# Patient Record
Sex: Female | Born: 1950 | Race: Black or African American | Hispanic: No | Marital: Single | Smoking: Never smoker
Health system: Southern US, Community
[De-identification: ages and names within clinical notes are randomized; demographics above are authoritative.]

## PROBLEM LIST (undated history)

## (undated) DIAGNOSIS — G8929 Other chronic pain: Secondary | ICD-10-CM

## (undated) DIAGNOSIS — F1190 Opioid use, unspecified, uncomplicated: Secondary | ICD-10-CM

## (undated) DIAGNOSIS — I10 Essential (primary) hypertension: Secondary | ICD-10-CM

## (undated) DIAGNOSIS — E7800 Pure hypercholesterolemia, unspecified: Secondary | ICD-10-CM

## (undated) DIAGNOSIS — F32A Depression, unspecified: Secondary | ICD-10-CM

## (undated) DIAGNOSIS — K219 Gastro-esophageal reflux disease without esophagitis: Secondary | ICD-10-CM

## (undated) DIAGNOSIS — I729 Aneurysm of unspecified site: Secondary | ICD-10-CM

## (undated) DIAGNOSIS — K449 Diaphragmatic hernia without obstruction or gangrene: Secondary | ICD-10-CM

## (undated) DIAGNOSIS — F329 Major depressive disorder, single episode, unspecified: Secondary | ICD-10-CM

## (undated) DIAGNOSIS — M159 Polyosteoarthritis, unspecified: Secondary | ICD-10-CM

## (undated) HISTORY — DX: Depression, unspecified: F32.A

## (undated) HISTORY — PX: BLADDER SURGERY: SHX569

## (undated) HISTORY — PX: CHOLECYSTECTOMY: SHX55

## (undated) HISTORY — PX: COLONOSCOPY: SHX174

## (undated) HISTORY — DX: Polyosteoarthritis, unspecified: M15.9

## (undated) HISTORY — DX: Major depressive disorder, single episode, unspecified: F32.9

## (undated) HISTORY — PX: UTERINE FIBROID SURGERY: SHX826

---

## 1998-05-27 HISTORY — PX: BRAIN SURGERY: SHX531

## 2015-05-19 ENCOUNTER — Emergency Department (HOSPITAL_COMMUNITY): Payer: Commercial Managed Care - HMO

## 2015-05-19 ENCOUNTER — Observation Stay (HOSPITAL_COMMUNITY)
Admission: EM | Admit: 2015-05-19 | Discharge: 2015-05-20 | Disposition: A | Discharge status: Home / Self Care | Payer: Commercial Managed Care - HMO | Attending: Internal Medicine | Admitting: Internal Medicine

## 2015-05-19 ENCOUNTER — Encounter: Payer: Self-pay | Admitting: Orthopedic Surgery

## 2015-05-19 ENCOUNTER — Encounter (HOSPITAL_COMMUNITY): Payer: Self-pay | Admitting: Emergency Medicine

## 2015-05-19 DIAGNOSIS — K529 Noninfective gastroenteritis and colitis, unspecified: Principal | ICD-10-CM | POA: Diagnosis present

## 2015-05-19 DIAGNOSIS — I10 Essential (primary) hypertension: Secondary | ICD-10-CM | POA: Insufficient documentation

## 2015-05-19 DIAGNOSIS — K449 Diaphragmatic hernia without obstruction or gangrene: Secondary | ICD-10-CM | POA: Insufficient documentation

## 2015-05-19 DIAGNOSIS — R109 Unspecified abdominal pain: Secondary | ICD-10-CM | POA: Diagnosis present

## 2015-05-19 DIAGNOSIS — Z79899 Other long term (current) drug therapy: Secondary | ICD-10-CM | POA: Diagnosis not present

## 2015-05-19 DIAGNOSIS — E7800 Pure hypercholesterolemia, unspecified: Secondary | ICD-10-CM | POA: Insufficient documentation

## 2015-05-19 DIAGNOSIS — R509 Fever, unspecified: Secondary | ICD-10-CM | POA: Diagnosis not present

## 2015-05-19 HISTORY — DX: Pure hypercholesterolemia, unspecified: E78.00

## 2015-05-19 HISTORY — DX: Essential (primary) hypertension: I10

## 2015-05-19 HISTORY — DX: Diaphragmatic hernia without obstruction or gangrene: K44.9

## 2015-05-19 LAB — COMPREHENSIVE METABOLIC PANEL
ALBUMIN: 3.6 g/dL (ref 3.5–5.0)
ALT: 21 U/L (ref 14–54)
ANION GAP: 8 (ref 5–15)
AST: 21 U/L (ref 15–41)
Alkaline Phosphatase: 86 U/L (ref 38–126)
BILIRUBIN TOTAL: 1.2 mg/dL (ref 0.3–1.2)
BUN: 15 mg/dL (ref 6–20)
CHLORIDE: 103 mmol/L (ref 101–111)
CO2: 27 mmol/L (ref 22–32)
Calcium: 8.9 mg/dL (ref 8.9–10.3)
Creatinine, Ser: 1.12 mg/dL — ABNORMAL HIGH (ref 0.44–1.00)
GFR calc Af Amer: 59 mL/min — ABNORMAL LOW (ref >60)
GFR calc non Af Amer: 51 mL/min — ABNORMAL LOW (ref >60)
GLUCOSE: 109 mg/dL — AB (ref 65–99)
POTASSIUM: 3.6 mmol/L (ref 3.5–5.1)
SODIUM: 138 mmol/L (ref 135–145)
TOTAL PROTEIN: 7.2 g/dL (ref 6.5–8.1)

## 2015-05-19 LAB — URINALYSIS, ROUTINE W REFLEX MICROSCOPIC
BILIRUBIN URINE: NEGATIVE
Glucose, UA: NEGATIVE mg/dL
Hgb urine dipstick: NEGATIVE
KETONES UR: NEGATIVE mg/dL
Leukocytes, UA: NEGATIVE
NITRITE: NEGATIVE
PROTEIN: NEGATIVE mg/dL
Specific Gravity, Urine: 1.015 (ref 1.005–1.030)
pH: 8.5 — ABNORMAL HIGH (ref 5.0–8.0)

## 2015-05-19 LAB — CBC WITH DIFFERENTIAL/PLATELET
BASOS PCT: 0 %
Basophils Absolute: 0 10*3/uL (ref 0.0–0.1)
Eosinophils Absolute: 0 10*3/uL (ref 0.0–0.7)
Eosinophils Relative: 0 %
HEMATOCRIT: 40.2 % (ref 36.0–46.0)
Hemoglobin: 13.4 g/dL (ref 12.0–15.0)
Lymphocytes Relative: 16 %
Lymphs Abs: 1.1 10*3/uL (ref 0.7–4.0)
MCH: 29.6 pg (ref 26.0–34.0)
MCHC: 33.3 g/dL (ref 30.0–36.0)
MCV: 88.7 fL (ref 78.0–100.0)
MONO ABS: 0.5 10*3/uL (ref 0.1–1.0)
Monocytes Relative: 7 %
NEUTROS ABS: 5.6 10*3/uL (ref 1.7–7.7)
Neutrophils Relative %: 77 %
PLATELETS: 173 10*3/uL (ref 150–400)
RBC: 4.53 MIL/uL (ref 3.87–5.11)
RDW: 13 % (ref 11.5–15.5)
WBC: 7.2 10*3/uL (ref 4.0–10.5)

## 2015-05-19 LAB — LACTIC ACID, PLASMA
LACTIC ACID, VENOUS: 1.2 mmol/L (ref 0.5–2.0)
LACTIC ACID, VENOUS: 0.8 mmol/L (ref 0.5–2.0)

## 2015-05-19 LAB — LIPASE, BLOOD: Lipase: 18 U/L (ref 11–51)

## 2015-05-19 LAB — TROPONIN I: Troponin I: <0.03 ng/mL (ref <0.031)

## 2015-05-19 MED ORDER — DICYCLOMINE HCL 10 MG/ML IM SOLN
20.0000 mg | Freq: Once | INTRAMUSCULAR | Status: AC
  Administered 2015-05-19: 20 mg via INTRAMUSCULAR
  Filled 2015-05-19: qty 2

## 2015-05-19 MED ORDER — HYDROCHLOROTHIAZIDE 25 MG PO TABS
25.0000 mg | ORAL_TABLET | Freq: Every day | ORAL | Status: DC
  Administered 2015-05-20: 25 mg via ORAL
  Filled 2015-05-19: qty 1

## 2015-05-19 MED ORDER — SODIUM CHLORIDE 0.9 % IV SOLN
INTRAVENOUS | Status: DC
  Administered 2015-05-19 – 2015-05-20 (×3): 100 mL/h via INTRAVENOUS

## 2015-05-19 MED ORDER — HEPARIN SODIUM (PORCINE) 5000 UNIT/ML IJ SOLN
5000.0000 [IU] | Freq: Three times a day (TID) | INTRAMUSCULAR | Status: DC
  Administered 2015-05-19 – 2015-05-20 (×2): 5000 [IU] via SUBCUTANEOUS
  Filled 2015-05-19 (×2): qty 1

## 2015-05-19 MED ORDER — ONDANSETRON HCL 4 MG/2ML IJ SOLN: 4.0000 mg | Freq: Four times a day (QID) | INTRAMUSCULAR | Status: DC | PRN

## 2015-05-19 MED ORDER — POTASSIUM CHLORIDE CRYS ER 10 MEQ PO TBCR
10.0000 meq | EXTENDED_RELEASE_TABLET | Freq: Two times a day (BID) | ORAL | Status: DC
  Administered 2015-05-19 – 2015-05-20 (×2): 10 meq via ORAL
  Filled 2015-05-19 (×2): qty 1

## 2015-05-19 MED ORDER — IOHEXOL 300 MG/ML  SOLN
100.0000 mL | Freq: Once | INTRAMUSCULAR | Status: AC | PRN
  Administered 2015-05-19: 100 mL via INTRAVENOUS

## 2015-05-19 MED ORDER — ACETAMINOPHEN 325 MG PO TABS
650.0000 mg | ORAL_TABLET | Freq: Four times a day (QID) | ORAL | Status: DC | PRN
Start: 2015-05-19 — End: 2015-05-20

## 2015-05-19 MED ORDER — METRONIDAZOLE IN NACL 5-0.79 MG/ML-% IV SOLN: 500.0000 mg | Freq: Three times a day (TID) | INTRAVENOUS | Status: DC

## 2015-05-19 MED ORDER — IOHEXOL 300 MG/ML  SOLN
50.0000 mL | Freq: Once | INTRAMUSCULAR | Status: AC | PRN
  Administered 2015-05-19: 50 mL via ORAL

## 2015-05-19 MED ORDER — HYDROCODONE-ACETAMINOPHEN 10-325 MG PO TABS
1.0000 | ORAL_TABLET | Freq: Four times a day (QID) | ORAL | Status: DC | PRN
  Administered 2015-05-19: 1 via ORAL
  Filled 2015-05-19: qty 1

## 2015-05-19 MED ORDER — ONDANSETRON HCL 4 MG/2ML IJ SOLN
4.0000 mg | INTRAMUSCULAR | Status: DC | PRN
  Filled 2015-05-19: qty 2

## 2015-05-19 MED ORDER — PROMETHAZINE HCL 25 MG/ML IJ SOLN: 12.5000 mg | Freq: Four times a day (QID) | INTRAMUSCULAR | Status: DC | PRN

## 2015-05-19 MED ORDER — FAMOTIDINE IN NACL 20-0.9 MG/50ML-% IV SOLN
20.0000 mg | Freq: Once | INTRAVENOUS | Status: AC
  Administered 2015-05-19: 20 mg via INTRAVENOUS
  Filled 2015-05-19: qty 50

## 2015-05-19 MED ORDER — CIPROFLOXACIN IN D5W 400 MG/200ML IV SOLN
400.0000 mg | Freq: Two times a day (BID) | INTRAVENOUS | Status: DC
  Administered 2015-05-19 – 2015-05-20 (×2): 400 mg via INTRAVENOUS
  Filled 2015-05-19 (×2): qty 200

## 2015-05-19 MED ORDER — ACETAMINOPHEN 500 MG PO TABS
1000.0000 mg | ORAL_TABLET | Freq: Once | ORAL | Status: AC
  Administered 2015-05-19: 1000 mg via ORAL
  Filled 2015-05-19: qty 2

## 2015-05-19 MED ORDER — SIMVASTATIN 20 MG PO TABS: 40.0000 mg | ORAL_TABLET | Freq: Every day | ORAL | Status: DC

## 2015-05-19 MED ORDER — METRONIDAZOLE IN NACL 5-0.79 MG/ML-% IV SOLN
500.0000 mg | Freq: Once | INTRAVENOUS | Status: AC
  Administered 2015-05-19: 500 mg via INTRAVENOUS
  Filled 2015-05-19: qty 100

## 2015-05-19 MED ORDER — FAMOTIDINE 20 MG PO TABS: 40.0000 mg | ORAL_TABLET | Freq: Every day | ORAL | Status: DC

## 2015-05-19 MED ORDER — LOSARTAN POTASSIUM 50 MG PO TABS
100.0000 mg | ORAL_TABLET | Freq: Every day | ORAL | Status: DC
  Administered 2015-05-20: 100 mg via ORAL
  Filled 2015-05-19: qty 2

## 2015-05-19 MED ORDER — LOSARTAN POTASSIUM-HCTZ 100-25 MG PO TABS: 1.0000 | ORAL_TABLET | Freq: Every day | ORAL | Status: DC

## 2015-05-19 MED ORDER — FAMOTIDINE 20 MG PO TABS
40.0000 mg | ORAL_TABLET | Freq: Every day | ORAL | Status: DC
  Administered 2015-05-20: 40 mg via ORAL
  Filled 2015-05-19: qty 2

## 2015-05-19 MED ORDER — METRONIDAZOLE IN NACL 5-0.79 MG/ML-% IV SOLN
500.0000 mg | Freq: Three times a day (TID) | INTRAVENOUS | Status: DC
  Administered 2015-05-20: 500 mg via INTRAVENOUS
  Filled 2015-05-19: qty 100

## 2015-05-19 MED ORDER — MELOXICAM 7.5 MG PO TABS
15.0000 mg | ORAL_TABLET | Freq: Every day | ORAL | Status: DC | PRN
  Filled 2015-05-19: qty 1

## 2015-05-19 NOTE — ED Provider Notes (Signed)
CSN: [id number]     Arrival date & time 05/19/15  1615 History   First MD Initiated Contact with Patient 05/19/15 1622     Chief Complaint  Patient presents with  . Abdominal Pain  . Nausea     HPI  Pt was seen at 1625.  Per pt, c/o gradual onset and persistence of constant generalized abd "pain" for the past 2 to 3 days.  Has been associated with nausea and urinary frequency.  Describes the abd pain as "cramping."  Denies vomiting/diarrhea, no fevers, no back pain, no rash, no CP/SOB, no black or blood in stools, no dysuria.    Past Medical History  Diagnosis Date  . Hiatal hernia   . Hypertension   . High cholesterol    Past Surgical History  Procedure Laterality Date  . Brain surgery      Social History  Substance Use Topics  . Smoking status: Never Smoker   . Smokeless tobacco: None  . Alcohol Use: No    Review of Systems ROS: Statement: All systems negative except as marked or noted in the HPI; Constitutional: Negative for fever and chills. ; ; Eyes: Negative for eye pain, redness and discharge. ; ; ENMT: Negative for ear pain, hoarseness, nasal congestion, sinus pressure and sore throat. ; ; Cardiovascular: Negative for chest pain, palpitations, diaphoresis, dyspnea and peripheral edema. ; ; Respiratory: Negative for cough, wheezing and stridor. ; ; Gastrointestinal: +nausea, abd pain. Negative for vomiting, diarrhea, blood in stool, hematemesis, jaundice and rectal bleeding. . ; ; Genitourinary: +urinary frequency. Negative for dysuria, flank pain and hematuria. ; ; Musculoskeletal: Negative for back pain and neck pain. Negative for swelling and trauma.; ; Skin: Negative for pruritus, rash, abrasions, blisters, bruising and skin lesion.; ; Neuro: Negative for headache, lightheadedness and neck stiffness. Negative for weakness, altered level of consciousness , altered mental status, extremity weakness, paresthesias, involuntary movement, seizure and syncope.        Allergies  Review of patient's allergies indicates no known allergies.  Home Medications   Prior to Admission medications   Medication Sig Start Date End Date Taking? Authorizing Provider  ARIPiprazole (ABILIFY) 5 MG tablet Take 5 mg by mouth daily. 05/12/15   Historical Provider, MD  HYDROcodone-acetaminophen (NORCO) 10-325 MG tablet Take 1 tablet by mouth every 6 (six) hours as needed. 05/08/15   Historical Provider, MD  losartan-hydrochlorothiazide (HYZAAR) 100-25 MG tablet Take 1 tablet by mouth daily. 05/12/15   Historical Provider, MD  meloxicam (MOBIC) 15 MG tablet Take 15 mg by mouth daily. 05/12/15   Historical Provider, MD  potassium chloride (K-DUR,KLOR-CON) 10 MEQ tablet Take 10 mEq by mouth 2 (two) times daily. 05/12/15   Historical Provider, MD  ranitidine (ZANTAC) 300 MG tablet Take 300 mg by mouth daily. 05/12/15   Historical Provider, MD  simvastatin (ZOCOR) 40 MG tablet Take 40 mg by mouth daily. 05/12/15   Historical Provider, MD   BP 117/70 mmHg  Pulse 117  Temp(Src) 101.7 F (38.7 C) (Oral)  Resp 17  Ht 5\' 2"  (1.575 m)  Wt 215 lb (97.523 kg)  BMI 39.31 kg/m2  SpO2 96%   Patient Vitals for the past 24 hrs:  BP Temp Temp src Pulse Resp SpO2 Height Weight  05/19/15 2135 120/69 mmHg 98 F (36.7 C) Oral 100 (!) 22 100 % 5\' 2"  (1.575 m) 210 lb 15.7 oz (95.7 kg)  05/19/15 1930 100/61 mmHg - - - 24 - - -  05/19/15 1901 - 102.9  F (39.4 C) Oral - - - - -  05/19/15 1830 117/70 mmHg - - 117 17 96 % - -  05/19/15 1800 137/68 mmHg - - - (!) 34 - - -  05/19/15 1735 - 101.7 F (38.7 C) Oral - - - - -  05/19/15 1700 141/92 mmHg - - - 16 - - -    Physical Exam  1630: Physical examination:  Nursing notes reviewed; Vital signs and O2 SAT reviewed; +febrile.;; Constitutional: Well developed, Well nourished, In no acute distress; Head:  Normocephalic, atraumatic; Eyes: EOMI, PERRL, No scleral icterus; ENMT: Mouth and pharynx normal, Mucous membranes dry; Neck:  Supple, Full range of motion, No lymphadenopathy; Cardiovascular: Tachycardic rate and rhythm, No gallop; Respiratory: Breath sounds clear & equal bilaterally, No wheezes.  Speaking full sentences with ease, Normal respiratory effort/excursion; Chest: Nontender, Movement normal; Abdomen: Soft, +mild diffuse tenderness to palp. No rebound or guarding. Nondistended, Normal bowel sounds; Genitourinary: No CVA tenderness; Extremities: Pulses normal, No tenderness, No edema, No calf edema or asymmetry.; Neuro: AA&Ox3, Major CN grossly intact.  Speech clear. No gross focal motor or sensory deficits in extremities.; Skin: Color normal, Warm, Dry.   ED Course  Procedures (including critical care time) Labs Review   Imaging Review  I have personally reviewed and evaluated these images and lab results as part of my medical decision-making.   EKG Interpretation   Date/Time:  Friday May 19 2015 16:36:22 EST Ventricular Rate:  110 PR Interval:  132 QRS Duration: 92 QT Interval:  318 QTC Calculation: 430 R Axis:   50 Text Interpretation:  Sinus tachycardia Abnormal R-wave progression, early  transition Borderline repolarization abnormality No old tracing to compare  Confirmed by [facility]  MD, Nunzio Cory [phone]) on 05/19/2015 4:59:04 PM      MDM  MDM Reviewed: nursing note and vitals Interpretation: labs, CT scan, ECG and x-ray     Results for orders placed or performed during the hospital encounter of 05/19/15  Urinalysis, Routine w reflex microscopic  Result Value Ref Range   Color, Urine YELLOW YELLOW   APPearance CLEAR CLEAR   Specific Gravity, Urine 1.015 1.005 - 1.030   pH 8.5 (H) 5.0 - 8.0   Glucose, UA NEGATIVE NEGATIVE mg/dL   Hgb urine dipstick NEGATIVE NEGATIVE   Bilirubin Urine NEGATIVE NEGATIVE   Ketones, ur NEGATIVE NEGATIVE mg/dL   Protein, ur NEGATIVE NEGATIVE mg/dL   Nitrite NEGATIVE NEGATIVE   Leukocytes, UA NEGATIVE NEGATIVE  Comprehensive metabolic panel   Result Value Ref Range   Sodium 138 135 - 145 mmol/L   Potassium 3.6 3.5 - 5.1 mmol/L   Chloride 103 101 - 111 mmol/L   CO2 27 22 - 32 mmol/L   Glucose, Bld 109 (H) 65 - 99 mg/dL   BUN 15 6 - 20 mg/dL   Creatinine, Ser 1.12 (H) 0.44 - 1.00 mg/dL   Calcium 8.9 8.9 - 10.3 mg/dL   Total Protein 7.2 6.5 - 8.1 g/dL   Albumin 3.6 3.5 - 5.0 g/dL   AST 21 15 - 41 U/L   ALT 21 14 - 54 U/L   Alkaline Phosphatase 86 38 - 126 U/L   Total Bilirubin 1.2 0.3 - 1.2 mg/dL   GFR calc non Af Amer 51 (L) >60 mL/min   GFR calc Af Amer 59 (L) >60 mL/min   Anion gap 8 5 - 15  Lipase, blood  Result Value Ref Range   Lipase 18 11 - 51 U/L  Troponin I  Result Value Ref Range   Troponin I <0.03 <0.031 ng/mL  Lactic acid, plasma  Result Value Ref Range   Lactic Acid, Venous 1.2 0.5 - 2.0 mmol/L  CBC with Differential  Result Value Ref Range   WBC 7.2 4.0 - 10.5 K/uL   RBC 4.53 3.87 - 5.11 MIL/uL   Hemoglobin 13.4 12.0 - 15.0 g/dL   HCT 40.2 36.0 - 46.0 %   MCV 88.7 78.0 - 100.0 fL   MCH 29.6 26.0 - 34.0 pg   MCHC 33.3 30.0 - 36.0 g/dL   RDW 13.0 11.5 - 15.5 %   Platelets 173 150 - 400 K/uL   Neutrophils Relative % 77 %   Neutro Abs 5.6 1.7 - 7.7 K/uL   Lymphocytes Relative 16 %   Lymphs Abs 1.1 0.7 - 4.0 K/uL   Monocytes Relative 7 %   Monocytes Absolute 0.5 0.1 - 1.0 K/uL   Eosinophils Relative 0 %   Eosinophils Absolute 0.0 0.0 - 0.7 K/uL   Basophils Relative 0 %   Basophils Absolute 0.0 0.0 - 0.1 K/uL   Dg Chest 2 View 05/19/2015  CLINICAL DATA:  64 year old female with fever nausea and lower abdominal pain x3 days. EXAM: CHEST  2 VIEW COMPARISON:  None. FINDINGS: Two views of the chest do not demonstrate any focal consolidation. There is no pleural effusion or pneumothorax. Mild cardiomegaly. The osseous structures are grossly unremarkable. IMPRESSION: No active cardiopulmonary disease. Electronically Signed   By: Anner Crete M.D.   On: 05/19/2015 18:27   Ct Abdomen Pelvis W  Contrast 05/19/2015  CLINICAL DATA:  Patient c/o low abd pain and urinary frequency x 3 days/ also c/o nausea; hx HTN, hiatal hernia EXAM: CT ABDOMEN AND PELVIS WITH CONTRAST TECHNIQUE: Multidetector CT imaging of the abdomen and pelvis was performed using the standard protocol following bolus administration of intravenous contrast. CONTRAST:  5mL OMNIPAQUE IOHEXOL 300 MG/ML SOLN, 110mL OMNIPAQUE IOHEXOL 300 MG/ML SOLN COMPARISON:  None. FINDINGS: Visualized lung bases clear. Surgical clips in the gallbladder fossa. Unremarkable liver, spleen, adrenal glands, kidneys, pancreas, portal vein, and abdominal aorta. Small hiatal hernia. Stomach, small bowel, and colon are nondilated. Normal appendix. Mild wall thickening in the cecum with mild adjacent inflammatory/ edematous changes. Subcentimeter right mesenteric lymph nodes. Scattered transverse, descending, and sigmoid diverticula without significant adjacent inflammatory/ edematous change. Prominent uterus, with Complex 3.6 cm masslike process centrally in the endometrial cavity. Adnexal regions unremarkable. Urinary bladder incompletely distended. No ascites.  No free air.  No adenopathy. Spondylitic changes in the visualized lower thoracic spine. Bilateral sacroiliitis. IMPRESSION: 1. Mild wall thickening in the proximal ascending colon with some mild adjacent inflammatory/edematous changes suggesting colitis. 2. Masslike process in the endometrial cavity, may represent degenerating submucosal fibroid versus endometrial neoplasm. Recommend gynecologic follow-up. 3. Transverse, descending, and sigmoid diverticulosis. Electronically Signed   By: Lucrezia Europe M.D.   On: 05/19/2015 18:25    1920:  Pt's significant other at bedside demanding pt receive "dilaudid" because "she'll die without it." Pt herself denies she is in pain. Pt has received tylenol for her fever. Significant other states pt "doesn't need tylenol" because "it will shut down your kidneys."  Attempted to explain pharmacology of APAP, but pt's significant other kept interrupting me and talking over me when I attempted to speak, telling me that I "was wrong" and "don't know what you're talking about like most people."  Dx and testing d/w pt and family.  Questions answered.  Verb understanding, agreeable to  admit. T/C to [facility]. Alcario Drought, case discussed, including:  HPI, pertinent PM/SHx, VS/PE, dx testing, ED course and treatment:  Agreeable to admit, requests to write temporary orders, obtain medical bed to team [facility].   Francine Graven, DO 05/20/15 1640

## 2015-05-19 NOTE — ED Notes (Signed)
Pt resting comfortably at this time.  Significant other at bedside states that she "needs Dilaudid".  Pt is not requesting pain meds at this time.  Sig. Other became very argumentative that pt would die without pain medication.  Pt denies severe pain at this time and states that she is "uncomfortable".  Advised visitor that narcotic pain medication is not warranted for being uncomfortable and that I would make the doctor aware.  Pt is silent at this time and let visitor rant.  Visitor states that if she continues to not receive pain medications he may just take her to [facility].

## 2015-05-19 NOTE — ED Notes (Signed)
Pt states that she has been having lower abdominal pain with urinary frequency for about 3 days with nausea.

## 2015-05-19 NOTE — H&P (Addendum)
[facility] History and Physical  Tangerine Looby [id number] DOB: 1950/11/12 DOA: 05/19/2015  Referring physician: EDP PCP: No primary care provider on file.   Chief Complaint: Abd pain   HPI: Sandra Gross is a 64 y.o. female who presents to the ED with c/o abdominal pain.  Pain is generalized throughout abdomen, constant since onset 2-3 days ago.  Associated with nausea.  Pain is "cramping".  No vomiting or diarrhea.  Denies subjective fevers (objectively Tm is 102.9 in ED).  Review of Systems: Systems reviewed.  As above, otherwise negative  Past Medical History  Diagnosis Date  . Hiatal hernia   . Hypertension   . High cholesterol    Past Surgical History  Procedure Laterality Date  . Brain surgery     Social History:  reports that she has never smoked. She does not have any smokeless tobacco history on file. She reports that she does not drink alcohol or use illicit drugs.  No Known Allergies  History reviewed. No pertinent family history.   Prior to Admission medications   Medication Sig Start Date End Date Taking? Authorizing Provider  HYDROcodone-acetaminophen (NORCO) 10-325 MG tablet Take 1 tablet by mouth every 6 (six) hours as needed for moderate pain or severe pain.  05/08/15  Yes Historical Provider, MD  losartan-hydrochlorothiazide (HYZAAR) 100-25 MG tablet Take 1 tablet by mouth daily. 05/12/15  Yes Historical Provider, MD  meloxicam (MOBIC) 15 MG tablet Take 15 mg by mouth daily as needed for pain.  05/12/15  Yes Historical Provider, MD  potassium chloride (K-DUR,KLOR-CON) 10 MEQ tablet Take 10 mEq by mouth 2 (two) times daily. 05/12/15  Yes Historical Provider, MD  ranitidine (ZANTAC) 300 MG tablet Take 300 mg by mouth daily. 05/12/15  Yes Historical Provider, MD  simvastatin (ZOCOR) 40 MG tablet Take 40 mg by mouth daily. 05/12/15  Yes Historical Provider, MD   Physical Exam: Filed Vitals:   05/19/15 1901 05/19/15 1930  BP:  100/61  Pulse:     Temp: 102.9 F (39.4 C)   Resp:  24    BP 100/61 mmHg  Pulse 117  Temp(Src) 102.9 F (39.4 C) (Oral)  Resp 24  Ht 5\' 2"  (1.575 m)  Wt 97.523 kg (215 lb)  BMI 39.31 kg/m2  SpO2 96%  General Appearance:    Alert, oriented, no distress, appears stated age  Head:    Normocephalic, atraumatic  Eyes:    PERRL, EOMI, sclera non-icteric        Nose:   Nares without drainage or epistaxis. Mucosa, turbinates normal  Throat:   Moist mucous membranes. Oropharynx without erythema or exudate.  Neck:   Supple. No carotid bruits.  No thyromegaly.  No lymphadenopathy.   Back:     No CVA tenderness, no spinal tenderness  Lungs:     Clear to auscultation bilaterally, without wheezes, rhonchi or rales  Chest wall:    No tenderness to palpitation  Heart:    Regular rate and rhythm without murmurs, gallops, rubs  Abdomen:     Soft, non-tender, nondistended, normal bowel sounds, no organomegaly  Genitalia:    deferred  Rectal:    deferred  Extremities:   No clubbing, cyanosis or edema.  Pulses:   2+ and symmetric all extremities  Skin:   Skin color, texture, turgor normal, no rashes or lesions  Lymph nodes:   Cervical, supraclavicular, and axillary nodes normal  Neurologic:   CNII-XII intact. Normal strength, sensation and reflexes      throughout  Labs on Admission:  Basic Metabolic Panel:  Recent Labs Lab 05/19/15 1704  NA 138  K 3.6  CL 103  CO2 27  GLUCOSE 109*  BUN 15  CREATININE 1.12*  CALCIUM 8.9   Liver Function Tests:  Recent Labs Lab 05/19/15 1704  AST 21  ALT 21  ALKPHOS 86  BILITOT 1.2  PROT 7.2  ALBUMIN 3.6    Recent Labs Lab 05/19/15 1704  LIPASE 18   No results for input(s): AMMONIA in the last 168 hours. CBC:  Recent Labs Lab 05/19/15 1704  WBC 7.2  NEUTROABS 5.6  HGB 13.4  HCT 40.2  MCV 88.7  PLT 173   Cardiac Enzymes:  Recent Labs Lab 05/19/15 1704  TROPONINI <0.03    BNP (last 3 results) No results for input(s): PROBNP in  the last 8760 hours. CBG: No results for input(s): GLUCAP in the last 168 hours.  Radiological Exams on Admission: Dg Chest 2 View  05/19/2015  CLINICAL DATA:  64 year old female with fever nausea and lower abdominal pain x3 days. EXAM: CHEST  2 VIEW COMPARISON:  None. FINDINGS: Two views of the chest do not demonstrate any focal consolidation. There is no pleural effusion or pneumothorax. Mild cardiomegaly. The osseous structures are grossly unremarkable. IMPRESSION: No active cardiopulmonary disease. Electronically Signed   By: Anner Crete M.D.   On: 05/19/2015 18:27   Ct Abdomen Pelvis W Contrast  05/19/2015  CLINICAL DATA:  Patient c/o low abd pain and urinary frequency x 3 days/ also c/o nausea; hx HTN, hiatal hernia EXAM: CT ABDOMEN AND PELVIS WITH CONTRAST TECHNIQUE: Multidetector CT imaging of the abdomen and pelvis was performed using the standard protocol following bolus administration of intravenous contrast. CONTRAST:  20mL OMNIPAQUE IOHEXOL 300 MG/ML SOLN, 136mL OMNIPAQUE IOHEXOL 300 MG/ML SOLN COMPARISON:  None. FINDINGS: Visualized lung bases clear. Surgical clips in the gallbladder fossa. Unremarkable liver, spleen, adrenal glands, kidneys, pancreas, portal vein, and abdominal aorta. Small hiatal hernia. Stomach, small bowel, and colon are nondilated. Normal appendix. Mild wall thickening in the cecum with mild adjacent inflammatory/ edematous changes. Subcentimeter right mesenteric lymph nodes. Scattered transverse, descending, and sigmoid diverticula without significant adjacent inflammatory/ edematous change. Prominent uterus, with Complex 3.6 cm masslike process centrally in the endometrial cavity. Adnexal regions unremarkable. Urinary bladder incompletely distended. No ascites.  No free air.  No adenopathy. Spondylitic changes in the visualized lower thoracic spine. Bilateral sacroiliitis. IMPRESSION: 1. Mild wall thickening in the proximal ascending colon with some mild adjacent  inflammatory/edematous changes suggesting colitis. 2. Masslike process in the endometrial cavity, may represent degenerating submucosal fibroid versus endometrial neoplasm. Recommend gynecologic follow-up. 3. Transverse, descending, and sigmoid diverticulosis. Electronically Signed   By: Lucrezia Europe M.D.   On: 05/19/2015 18:25    EKG: Independently reviewed.  Assessment/Plan Principal Problem:   Colitis Active Problems:   HTN (hypertension)   1. Colitis - 1. Empiric cipro/flagyl 2. IVF 3. Nausea control 4. Tylenol PRN fever 2. HTN - continue home meds    Code Status: Full  Family Communication: Husband at bedside Disposition Plan: Admit to obs   Time spent: 50 min  Marisa Hufstetler M. [facility] Pager [phone]  If 7AM-7PM, please contact the day team taking care of the patient [email or web address] Password [facility] 05/19/2015, 7:58 PM

## 2015-05-19 NOTE — Progress Notes (Signed)
ANTIBIOTIC CONSULT NOTE - INITIAL  Pharmacy Consult for Cipro Indication: intra abdominal infection  No Known Allergies  Patient Measurements: Height: 5\' 2"  (157.5 cm) Weight: 215 lb (97.523 kg) IBW/kg (Calculated) : 50.1 Adjusted Body Weight:   Vital Signs: Temp: 101.7 F (38.7 C) (12/23 1735) Temp Source: Oral (12/23 1735) BP: 117/70 mmHg (12/23 1830) Pulse Rate: 117 (12/23 1830) Intake/Output from previous day:   Intake/Output from this shift:    Labs:  Recent Labs  05/19/15 1704  WBC 7.2  HGB 13.4  PLT 173  CREATININE 1.12*   Estimated Creatinine Clearance: 55.4 mL/min (by C-G formula based on Cr of 1.12). No results for input(s): VANCOTROUGH, VANCOPEAK, VANCORANDOM, GENTTROUGH, GENTPEAK, GENTRANDOM, TOBRATROUGH, TOBRAPEAK, TOBRARND, AMIKACINPEAK, AMIKACINTROU, AMIKACIN in the last 72 hours.   Microbiology: No results found for this or any previous visit (from the past 720 hour(s)).  Medical History: Past Medical History  Diagnosis Date  . Hiatal hernia   . Hypertension   . High cholesterol     Medications:   (Not in a hospital admission) Assessment: 64 yo female ED patient, lower abdominal pain for 3 days with nausea. Cipro per pharmacy consult  Goal of Therapy:  Eradicate infection  Plan:  Cipro 400 mg IV every 12 hours Monitor renal function Labs per protocol F/U oral therapy when appropriate  Abner Greenspan, Advit Trethewey Bennett 05/19/2015,6:55 PM

## 2015-05-20 DIAGNOSIS — K529 Noninfective gastroenteritis and colitis, unspecified: Secondary | ICD-10-CM

## 2015-05-20 DIAGNOSIS — I10 Essential (primary) hypertension: Secondary | ICD-10-CM | POA: Diagnosis not present

## 2015-05-20 DIAGNOSIS — E876 Hypokalemia: Secondary | ICD-10-CM

## 2015-05-20 LAB — BASIC METABOLIC PANEL
ANION GAP: 8 (ref 5–15)
BUN: 16 mg/dL (ref 6–20)
CALCIUM: 7.9 mg/dL — AB (ref 8.9–10.3)
CO2: 25 mmol/L (ref 22–32)
Chloride: 105 mmol/L (ref 101–111)
Creatinine, Ser: 1.17 mg/dL — ABNORMAL HIGH (ref 0.44–1.00)
GFR calc Af Amer: 56 mL/min — ABNORMAL LOW (ref >60)
GFR calc non Af Amer: 48 mL/min — ABNORMAL LOW (ref >60)
GLUCOSE: 105 mg/dL — AB (ref 65–99)
Potassium: 3.2 mmol/L — ABNORMAL LOW (ref 3.5–5.1)
Sodium: 138 mmol/L (ref 135–145)

## 2015-05-20 LAB — CBC
HEMATOCRIT: 36.8 % (ref 36.0–46.0)
Hemoglobin: 12.1 g/dL (ref 12.0–15.0)
MCH: 29.2 pg (ref 26.0–34.0)
MCHC: 32.9 g/dL (ref 30.0–36.0)
MCV: 88.9 fL (ref 78.0–100.0)
Platelets: 164 10*3/uL (ref 150–400)
RBC: 4.14 MIL/uL (ref 3.87–5.11)
RDW: 13.2 % (ref 11.5–15.5)
WBC: 6.1 10*3/uL (ref 4.0–10.5)

## 2015-05-20 MED ORDER — HYDROCODONE-ACETAMINOPHEN 10-325 MG PO TABS: 1.0000 | ORAL_TABLET | Freq: Four times a day (QID) | ORAL | Status: DC | PRN

## 2015-05-20 MED ORDER — CIPROFLOXACIN HCL 500 MG PO TABS: 500.0000 mg | ORAL_TABLET | Freq: Two times a day (BID) | ORAL | Status: DC

## 2015-05-20 MED ORDER — METRONIDAZOLE 500 MG PO TABS: 500.0000 mg | ORAL_TABLET | Freq: Three times a day (TID) | ORAL | Status: DC

## 2015-05-20 MED ORDER — POTASSIUM CHLORIDE CRYS ER 20 MEQ PO TBCR
40.0000 meq | EXTENDED_RELEASE_TABLET | Freq: Once | ORAL | Status: AC
  Administered 2015-05-20: 40 meq via ORAL
  Filled 2015-05-20: qty 2

## 2015-05-20 NOTE — Progress Notes (Signed)
Discharge instructions given on medications, and follow up visits,patient verbalized understanding .Prescriptions sent  To Pharmacy of choice documented on AVS.. Accompanied by staff to awaiting vehicle.

## 2015-05-20 NOTE — Discharge Summary (Signed)
Physician Discharge Summary  Sandra Gross [id number] DOB: 02-Oct-1950 DOA: 05/19/2015  PCP: Rosita Fire, MD  Admit date: 05/19/2015 Discharge date: 05/20/2015  Time spent: 25 minutes  Recommendations for Outpatient Follow-up:  1. Follow up with PCP in 1-2 weeks. 2. Please consider outpatient gynecology follow up for possible fibroid vs endometrial neoplasm.   Discharge Diagnoses:  Principal Problem:   Colitis Active Problems:   HTN (hypertension)   Discharge Condition: Improved  Diet recommendation: Heart- healthy  Filed Weights   05/19/15 1632 05/19/15 2135  Weight: 97.523 kg (215 lb) 95.7 kg (210 lb 15.7 oz)    History of present illness:  64 y.o. female with a hx of HTN presented to the ED with c/o abdominal pain and nausea. Abdominal CT revealed colitis. She was admitted for further management.   Hospital Course:  Patient presented with abdominal pain and nausea. Found to have proxima ascending colitis on abdominal CT.  She was started on antiemetics and IV abx with significant improvement. Her abdominal pain and nausea have resolved and she is tolerating a normal diet without any complications. She will be discharged on oral abx with plans to follow up with her PCP.   1. Masslike process in the endometrial cavity, may represent degenerating fibroid versus endometrial neoplasm. Seen on abdominal CT. Currently she is not having any significant vaginal bleeding. Will need f/u with gynecology.  2. Essential HTN, stable. Continue home meds. 3. Hypokalemia, replaced.   Procedures:  none  Consultations:  none  Discharge Exam: Filed Vitals:   05/19/15 2135 05/20/15 0400  BP: 120/69 108/66  Pulse: 100 82  Temp: 98 F (36.7 C) 98 F (36.7 C)  Resp: 22 20     General: NAD, looks comfortable  Cardiovascular: RRR, S1, S2   Respiratory: clear bilaterally, No wheezing, rales or rhonchi  Abdomen: soft, non tender, no distention , bowel sounds  normal  Musculoskeletal: No edema b/l   Discharge Instructions   Discharge Instructions    Diet - low sodium heart healthy    Complete by:  As directed      Increase activity slowly    Complete by:  As directed           Current Discharge Medication List    START taking these medications   Details  ciprofloxacin (CIPRO) 500 MG tablet Take 1 tablet (500 mg total) by mouth 2 (two) times daily. Qty: 18 tablet, Refills: 0    metroNIDAZOLE (FLAGYL) 500 MG tablet Take 1 tablet (500 mg total) by mouth 3 (three) times daily. Qty: 27 tablet, Refills: 0      CONTINUE these medications which have CHANGED   Details  HYDROcodone-acetaminophen (NORCO) 10-325 MG tablet Take 1 tablet by mouth every 6 (six) hours as needed for moderate pain or severe pain. Qty: 10 tablet, Refills: 0      CONTINUE these medications which have NOT CHANGED   Details  losartan-hydrochlorothiazide (HYZAAR) 100-25 MG tablet Take 1 tablet by mouth daily.    meloxicam (MOBIC) 15 MG tablet Take 15 mg by mouth daily as needed for pain.     potassium chloride (K-DUR,KLOR-CON) 10 MEQ tablet Take 10 mEq by mouth 2 (two) times daily.    ranitidine (ZANTAC) 300 MG tablet Take 300 mg by mouth daily.    simvastatin (ZOCOR) 40 MG tablet Take 40 mg by mouth daily.       No Known Allergies    The results of significant diagnostics from this hospitalization (including imaging, microbiology, ancillary  and laboratory) are listed below for reference.    Significant Diagnostic Studies: Dg Chest 2 View  05/19/2015  CLINICAL DATA:  64 year old female with fever nausea and lower abdominal pain x3 days. EXAM: CHEST  2 VIEW COMPARISON:  None. FINDINGS: Two views of the chest do not demonstrate any focal consolidation. There is no pleural effusion or pneumothorax. Mild cardiomegaly. The osseous structures are grossly unremarkable. IMPRESSION: No active cardiopulmonary disease. Electronically Signed   By: Anner Crete  M.D.   On: 05/19/2015 18:27   Ct Abdomen Pelvis W Contrast  05/19/2015  CLINICAL DATA:  Patient c/o low abd pain and urinary frequency x 3 days/ also c/o nausea; hx HTN, hiatal hernia EXAM: CT ABDOMEN AND PELVIS WITH CONTRAST TECHNIQUE: Multidetector CT imaging of the abdomen and pelvis was performed using the standard protocol following bolus administration of intravenous contrast. CONTRAST:  64mL OMNIPAQUE IOHEXOL 300 MG/ML SOLN, 129mL OMNIPAQUE IOHEXOL 300 MG/ML SOLN COMPARISON:  None. FINDINGS: Visualized lung bases clear. Surgical clips in the gallbladder fossa. Unremarkable liver, spleen, adrenal glands, kidneys, pancreas, portal vein, and abdominal aorta. Small hiatal hernia. Stomach, small bowel, and colon are nondilated. Normal appendix. Mild wall thickening in the cecum with mild adjacent inflammatory/ edematous changes. Subcentimeter right mesenteric lymph nodes. Scattered transverse, descending, and sigmoid diverticula without significant adjacent inflammatory/ edematous change. Prominent uterus, with Complex 3.6 cm masslike process centrally in the endometrial cavity. Adnexal regions unremarkable. Urinary bladder incompletely distended. No ascites.  No free air.  No adenopathy. Spondylitic changes in the visualized lower thoracic spine. Bilateral sacroiliitis. IMPRESSION: 1. Mild wall thickening in the proximal ascending colon with some mild adjacent inflammatory/edematous changes suggesting colitis. 2. Masslike process in the endometrial cavity, may represent degenerating submucosal fibroid versus endometrial neoplasm. Recommend gynecologic follow-up. 3. Transverse, descending, and sigmoid diverticulosis. Electronically Signed   By: Lucrezia Europe M.D.   On: 05/19/2015 18:25      Labs: Basic Metabolic Panel:  Recent Labs Lab 05/19/15 1704 05/20/15 0609  NA 138 138  K 3.6 3.2*  CL 103 105  CO2 27 25  GLUCOSE 109* 105*  BUN 15 16  CREATININE 1.12* 1.17*  CALCIUM 8.9 7.9*   Liver  Function Tests:  Recent Labs Lab 05/19/15 1704  AST 21  ALT 21  ALKPHOS 86  BILITOT 1.2  PROT 7.2  ALBUMIN 3.6    Recent Labs Lab 05/19/15 1704  LIPASE 18    CBC:  Recent Labs Lab 05/19/15 1704 05/20/15 0609  WBC 7.2 6.1  NEUTROABS 5.6  --   HGB 13.4 12.1  HCT 40.2 36.8  MCV 88.7 88.9  PLT 173 164   Cardiac Enzymes:  Recent Labs Lab 05/19/15 1704  TROPONINI <0.03     Signed: Jolaine Artist Tyrick Dunagan. MD  [facility] 05/20/2015, 9:44 AM  By signing my name below, I, Rosalie Doctor, attest that this documentation has been prepared under the direction and in the presence of [facility]. MD Electronically Signed: Rosalie Doctor, Scribe. 05/20/2015  .I, Dr. Kathie Dike, personally performed the services described in this documentaiton. All medical record entries made by the scribe were at my direction and in my presence. I have reviewed the chart and agree that the record reflects my personal performance and is accurate and complete  Kathie Dike, MD, 05/20/2015 9:44 AM

## 2015-05-22 LAB — URINE CULTURE

## 2015-08-21 ENCOUNTER — Encounter: Payer: Self-pay | Admitting: Obstetrics & Gynecology

## 2015-11-01 ENCOUNTER — Telehealth (HOSPITAL_COMMUNITY): Payer: Self-pay | Admitting: *Deleted

## 2015-11-01 NOTE — Telephone Encounter (Signed)
Office received ref from Dr. Legrand Rams office to sch new pt appt for pt. Called pt with number provided and lmtcb and number to call back was provided.

## 2015-11-01 NOTE — Telephone Encounter (Signed)
Called pt to sch appt due to office receiving referral from Dr. Legrand Rams. Spoke with and was going to sch appt but pt stated she did not want to sch appt and did not like coming to this office. Per pt, she told them that she did not want to be ref to this office and thought they sent her ref somewhere else. Office informed pt to call Dr. Legrand Rams office and let them know as well and pt agreed.

## 2015-11-14 ENCOUNTER — Encounter (HOSPITAL_COMMUNITY): Payer: Self-pay | Admitting: *Deleted

## 2015-11-14 ENCOUNTER — Emergency Department (HOSPITAL_COMMUNITY)
Admission: EM | Admit: 2015-11-14 | Discharge: 2015-11-14 | Disposition: A | Discharge status: Home / Self Care | Payer: Commercial Managed Care - HMO | Attending: Emergency Medicine | Admitting: Emergency Medicine

## 2015-11-14 ENCOUNTER — Other Ambulatory Visit: Payer: Self-pay

## 2015-11-14 ENCOUNTER — Emergency Department (HOSPITAL_COMMUNITY): Payer: Commercial Managed Care - HMO

## 2015-11-14 DIAGNOSIS — R110 Nausea: Secondary | ICD-10-CM | POA: Diagnosis not present

## 2015-11-14 DIAGNOSIS — Z79899 Other long term (current) drug therapy: Secondary | ICD-10-CM | POA: Diagnosis not present

## 2015-11-14 DIAGNOSIS — M25511 Pain in right shoulder: Secondary | ICD-10-CM | POA: Diagnosis present

## 2015-11-14 DIAGNOSIS — I10 Essential (primary) hypertension: Secondary | ICD-10-CM | POA: Insufficient documentation

## 2015-11-14 HISTORY — DX: Aneurysm of unspecified site: I72.9

## 2015-11-14 LAB — URINALYSIS, ROUTINE W REFLEX MICROSCOPIC
Bilirubin Urine: NEGATIVE
GLUCOSE, UA: NEGATIVE mg/dL
Hgb urine dipstick: NEGATIVE
KETONES UR: 15 mg/dL — AB
LEUKOCYTES UA: NEGATIVE
NITRITE: NEGATIVE
PH: 6 (ref 5.0–8.0)
Protein, ur: NEGATIVE mg/dL
SPECIFIC GRAVITY, URINE: 1.025 (ref 1.005–1.030)

## 2015-11-14 LAB — CBC
HEMATOCRIT: 38.4 % (ref 36.0–46.0)
HEMOGLOBIN: 12.5 g/dL (ref 12.0–15.0)
MCH: 28.7 pg (ref 26.0–34.0)
MCHC: 32.6 g/dL (ref 30.0–36.0)
MCV: 88.3 fL (ref 78.0–100.0)
Platelets: 206 10*3/uL (ref 150–400)
RBC: 4.35 MIL/uL (ref 3.87–5.11)
RDW: 13.2 % (ref 11.5–15.5)
WBC: 4.5 10*3/uL (ref 4.0–10.5)

## 2015-11-14 LAB — COMPREHENSIVE METABOLIC PANEL
ALBUMIN: 3.8 g/dL (ref 3.5–5.0)
ALT: 15 U/L (ref 14–54)
ANION GAP: 7 (ref 5–15)
AST: 21 U/L (ref 15–41)
Alkaline Phosphatase: 93 U/L (ref 38–126)
BILIRUBIN TOTAL: 1.3 mg/dL — AB (ref 0.3–1.2)
BUN: 19 mg/dL (ref 6–20)
CHLORIDE: 106 mmol/L (ref 101–111)
CO2: 26 mmol/L (ref 22–32)
Calcium: 8.9 mg/dL (ref 8.9–10.3)
Creatinine, Ser: 0.96 mg/dL (ref 0.44–1.00)
GFR calc Af Amer: >60 mL/min (ref >60)
GFR calc non Af Amer: >60 mL/min (ref >60)
Glucose, Bld: 90 mg/dL (ref 65–99)
POTASSIUM: 3.7 mmol/L (ref 3.5–5.1)
SODIUM: 139 mmol/L (ref 135–145)
TOTAL PROTEIN: 7.7 g/dL (ref 6.5–8.1)

## 2015-11-14 LAB — TROPONIN I: Troponin I: <0.03 ng/mL

## 2015-11-14 LAB — LIPASE, BLOOD: LIPASE: 18 U/L (ref 11–51)

## 2015-11-14 MED ORDER — ONDANSETRON 4 MG PO TBDP
4.0000 mg | ORAL_TABLET | Freq: Once | ORAL | Status: AC
  Administered 2015-11-14: 4 mg via ORAL
  Filled 2015-11-14: qty 1

## 2015-11-14 MED ORDER — OMEPRAZOLE 20 MG PO CPDR: 20.0000 mg | DELAYED_RELEASE_CAPSULE | Freq: Every day | ORAL | Status: DC

## 2015-11-14 MED ORDER — ONDANSETRON HCL 4 MG/2ML IJ SOLN
4.0000 mg | Freq: Once | INTRAMUSCULAR | Status: AC
  Administered 2015-11-14: 4 mg via INTRAMUSCULAR
  Filled 2015-11-14: qty 2

## 2015-11-14 MED ORDER — ONDANSETRON HCL 4 MG/2ML IJ SOLN: 4.0000 mg | Freq: Once | INTRAMUSCULAR | Status: DC

## 2015-11-14 MED ORDER — ONDANSETRON HCL 4 MG PO TABS: 4.0000 mg | ORAL_TABLET | Freq: Four times a day (QID) | ORAL | Status: DC

## 2015-11-14 MED ORDER — GI COCKTAIL ~~LOC~~
30.0000 mL | Freq: Once | ORAL | Status: AC
  Administered 2015-11-14: 30 mL via ORAL
  Filled 2015-11-14: qty 30

## 2015-11-14 NOTE — ED Notes (Signed)
Pt reports nausea and shoulder pain for over a month.

## 2015-11-14 NOTE — ED Notes (Signed)
Lab at bedside

## 2015-11-14 NOTE — ED Notes (Signed)
Pt reporting headache and nausea. EDP aware and reported would put in order for Zofran. nad noted.

## 2015-11-14 NOTE — ED Notes (Signed)
Pt asks if she is ready to go after meds- she is conversant and without complaint

## 2015-11-14 NOTE — ED Notes (Signed)
Pt has completed supper and has had sprite with her meal

## 2015-11-14 NOTE — ED Provider Notes (Signed)
CSN: [id number]     Arrival date & time 11/14/15  1553 History   First MD Initiated Contact with Patient 11/14/15 1611     Chief Complaint  Patient presents with  . Nausea     (Consider location/radiation/quality/duration/timing/severity/associated sxs/prior Treatment) HPI Comments: Patient presents by EMS with ongoing right shoulder pain and nausea for several months. Denies any injury to her shoulder. States she has pain with range of motion of her right shoulder that radiates into her upper chest at times. She called her PCP to her for x-rays which she did not get done because the wait was too long. She called EMS today because she's had persistent nausea and feeling sick to her stomach but hasn't had any vomiting. Denies any chest pain or shortness of breath. Denies abdominal pain or back pain. States a history of hiatal hernia and hypertension. Does not take any stomach medication other than Zantac. No weakness in her arm. No numbness or tingling. No head or neck pain.  The history is provided by the patient and the EMS personnel.    Past Medical History  Diagnosis Date  . Hiatal hernia   . Hypertension   . High cholesterol   . Aneurysm ([place])     pt states 2 plus years ago   Past Surgical History  Procedure Laterality Date  . Brain surgery     History reviewed. No pertinent family history. Social History  Substance Use Topics  . Smoking status: Never Smoker   . Smokeless tobacco: None  . Alcohol Use: No   OB History    No data available     Review of Systems  Constitutional: Positive for activity change and appetite change. Negative for fever and diaphoresis.  HENT: Negative for congestion.   Respiratory: Negative for cough, chest tightness and shortness of breath.   Cardiovascular: Negative for chest pain and leg swelling.  Gastrointestinal: Positive for nausea. Negative for vomiting and abdominal pain.  Genitourinary: Negative for dysuria and hematuria.   Musculoskeletal: Positive for myalgias and arthralgias. Negative for back pain and neck pain.  Skin: Negative for rash.  Neurological: Negative for dizziness, weakness and headaches.  A complete 10 system review of systems was obtained and all systems are negative except as noted in the HPI and PMH.      Allergies  Review of patient's allergies indicates no known allergies.  Home Medications   Prior to Admission medications   Medication Sig Start Date End Date Taking? Authorizing Provider  ciprofloxacin (CIPRO) 500 MG tablet Take 1 tablet (500 mg total) by mouth 2 (two) times daily. 05/20/15   Kathie Dike, MD  HYDROcodone-acetaminophen (NORCO) 10-325 MG tablet Take 1 tablet by mouth every 6 (six) hours as needed for moderate pain or severe pain. 05/20/15   Kathie Dike, MD  losartan-hydrochlorothiazide (HYZAAR) 100-25 MG tablet Take 1 tablet by mouth daily. 05/12/15   Historical Provider, MD  meloxicam (MOBIC) 15 MG tablet Take 15 mg by mouth daily as needed for pain.  05/12/15   Historical Provider, MD  metroNIDAZOLE (FLAGYL) 500 MG tablet Take 1 tablet (500 mg total) by mouth 3 (three) times daily. 05/20/15   Kathie Dike, MD  potassium chloride (K-DUR,KLOR-CON) 10 MEQ tablet Take 10 mEq by mouth 2 (two) times daily. 05/12/15   Historical Provider, MD  ranitidine (ZANTAC) 300 MG tablet Take 300 mg by mouth daily. 05/12/15   Historical Provider, MD  simvastatin (ZOCOR) 40 MG tablet Take 40 mg by mouth daily. 05/12/15  Historical Provider, MD   BP 159/87 mmHg  Pulse 85  Temp(Src) 98.1 F (36.7 C) (Oral)  Resp 18  Ht 5' (1.524 m)  Wt 210 lb (95.255 kg)  BMI 41.01 kg/m2  SpO2 97% Physical Exam  Constitutional: She is oriented to person, place, and time. She appears well-developed and well-nourished. No distress.  HENT:  Head: Normocephalic and atraumatic.  Mouth/Throat: Oropharynx is clear and moist. No oropharyngeal exudate.  Eyes: Conjunctivae and EOM are normal.  Pupils are equal, round, and reactive to light.  Neck: Normal range of motion. Neck supple.  No meningismus.  Cardiovascular: Normal rate, regular rhythm, normal heart sounds and intact distal pulses.   No murmur heard. Pulmonary/Chest: Effort normal and breath sounds normal. No respiratory distress.  Abdominal: Soft. There is no tenderness. There is no rebound and no guarding.  Musculoskeletal: Normal range of motion. She exhibits tenderness. She exhibits no edema.  No pain with range of motion of right shoulder. There is no appreciable effusion, erythema or deformity. Abduction and adduction intact. Able to raise arm without difficulty. Intact radial pulse, intact grip strength.  No pain to palpation of trapezius or paraspinal muscles  Neurological: She is alert and oriented to person, place, and time. No cranial nerve deficit. She exhibits normal muscle tone. Coordination normal.  No ataxia on finger to nose bilaterally. No pronator drift. 5/5 strength throughout. CN 2-12 intact.Equal grip strength. Sensation intact.   Skin: Skin is warm.  Psychiatric: She has a normal mood and affect. Her behavior is normal.  Nursing note and vitals reviewed.   ED Course  Procedures (including critical care time) Labs Review Labs Reviewed  COMPREHENSIVE METABOLIC PANEL - Abnormal; Notable for the following:    Total Bilirubin 1.3 (*)    All other components within normal limits  URINALYSIS, ROUTINE W REFLEX MICROSCOPIC (NOT AT [facility]) - Abnormal; Notable for the following:    Ketones, ur 15 (*)    All other components within normal limits  LIPASE, BLOOD  CBC  TROPONIN I  TROPONIN I    Imaging Review Dg Chest 2 View  11/14/2015  CLINICAL DATA:  Shortness of breath, nausea, and shoulder pain for 1 month. EXAM: CHEST  2 VIEW COMPARISON:  05/19/2015 FINDINGS: Mild cardiomegaly stable. Ectasia thoracic aorta appears unchanged. Low lung volumes are again noted, however both lungs remain clear. No  evidence of pleural effusion or pneumothorax. Thoracic spine degenerative changes noted. IMPRESSION: Mild cardiomegaly and low lung volumes.  No active lung disease. Electronically Signed   By: Earle Gell M.D.   On: 11/14/2015 17:12   Dg Cervical Spine Complete  11/14/2015  CLINICAL DATA:  Neck pain for 1 month.  No known injury. EXAM: CERVICAL SPINE - COMPLETE 4+ VIEW COMPARISON:  None. FINDINGS: There is no evidence of cervical spine fracture or prevertebral soft tissue swelling. Alignment is normal. Moderate degenerative disc disease is seen at levels of C4-5 and C5-6. Mild left-sided facet DJD also noted at L4-5. Mild cervical levoscoliosis also noted. IMPRESSION: No acute findings.  Degenerative spondylosis, as described above. Electronically Signed   By: Earle Gell M.D.   On: 11/14/2015 17:15   Dg Shoulder Right  11/14/2015  CLINICAL DATA:  Right shoulder pain for 1 month.  No known injury. EXAM: RIGHT SHOULDER - 2+ VIEW COMPARISON:  None. FINDINGS: There is no evidence of fracture or dislocation. Mild degenerative spurring seen along the inferior aspect of the glenohumeral joint. No other significant bone abnormality identified. Soft tissues  are unremarkable. IMPRESSION: No acute findings.  Mild glenohumeral osteoarthritis. Electronically Signed   By: Earle Gell M.D.   On: 11/14/2015 17:10   I have personally reviewed and evaluated these images and lab results as part of my medical decision-making.   EKG Interpretation   Date/Time:  Tuesday November 14 2015 16:19:49 EDT Ventricular Rate:  83 PR Interval:    QRS Duration: 122 QT Interval:  420 QTC Calculation: 494 R Axis:   17 Text Interpretation:  Sinus rhythm IVCD, consider atypical LBBB  Nonspecific ST and T wave abnormality Artifact Confirmed by Wyvonnia Dusky  MD,  Marquiz Sotelo [phone]) on 11/14/2015 4:24:41 PM      MDM   Final diagnoses:  Right shoulder pain  Nausea   Shoulder pain x 1 month. Persistent nausea for several months. No CP  or SOB.  Low suspicion for ACS.  EKG with nonspecific changes. Doubt PE, doubt aortic dissection. FROM R shoulder without pain. No hypoxia or tachycardia. Xray with arthritis.  Nausea improved with zofran.  Known hiatal hernia.  No abdominal tenderness. Will start PPI, continue antiemetic. Troponin negative x2.TOlerating PO and nausea has improved.  Continue PPI, follow up with PCP. Return precautions discussed.    Ezequiel Essex, MD 11/15/15 [phone]

## 2015-11-14 NOTE — ED Notes (Signed)
Pt reports that she is feeling better, but that she is hungry having had nothing to eat since breakfast- She is awaiting her troponin level at this time

## 2015-11-14 NOTE — ED Notes (Signed)
Eating a meal - moves ad lib without difficulty

## 2015-11-14 NOTE — Discharge Instructions (Signed)
Joint Pain Your Xray shows arthritis in your shoulder. Take the nausea and stomach medication as prescribed. Follow up with your doctor. Return to the ED if you develop new or worsening symptoms. Joint pain, which is also called arthralgia, can be caused by many things. Joint pain often goes away when you follow your health care provider's instructions for relieving pain at home. However, joint pain can also be caused by conditions that require further treatment. Common causes of joint pain include:  Bruising in the area of the joint.  Overuse of the joint.  Wear and tear on the joints that occur with aging (osteoarthritis).  Various other forms of arthritis.  A buildup of a crystal form of uric acid in the joint (gout).  Infections of the joint (septic arthritis) or of the bone (osteomyelitis). Your health care provider may recommend medicine to help with the pain. If your joint pain continues, additional tests may be needed to diagnose your condition. HOME CARE INSTRUCTIONS Watch your condition for any changes. Follow these instructions as directed to lessen the pain that you are feeling.  Take medicines only as directed by your health care provider.  Rest the affected area for as long as your health care provider says that you should. If directed to do so, raise the painful joint above the level of your heart while you are sitting or lying down.  Do not do things that cause or worsen pain.  If directed, apply ice to the painful area:  Put ice in a plastic bag.  Place a towel between your skin and the bag.  Leave the ice on for 20 minutes, 2-3 times per day.  Wear an elastic bandage, splint, or sling as directed by your health care provider. Loosen the elastic bandage or splint if your fingers or toes become numb and tingle, or if they turn cold and blue.  Begin exercising or stretching the affected area as directed by your health care provider. Ask your health care provider what  types of exercise are safe for you.  Keep all follow-up visits as directed by your health care provider. This is important. SEEK MEDICAL CARE IF:  Your pain increases, and medicine does not help.  Your joint pain does not improve within 3 days.  You have increased bruising or swelling.  You have a fever.  You lose 10 lb (4.5 kg) or more without trying. SEEK IMMEDIATE MEDICAL CARE IF:  You are not able to move the joint.  Your fingers or toes become numb or they turn cold and blue.   This information is not intended to replace advice given to you by your health care provider. Make sure you discuss any questions you have with your health care provider.   Document Released: 05/13/2005 Document Revised: 06/03/2014 Document Reviewed: 02/22/2014 Elsevier Interactive Patient Education [employer].

## 2015-11-14 NOTE — ED Notes (Signed)
PT c/o right shoulder pain sharp with ROM for over a month with nausea at times and states nausea became worse today but denies any vomiting or diarrhea. Last BM 11/13/15.

## 2015-11-21 ENCOUNTER — Emergency Department (HOSPITAL_COMMUNITY)
Admission: EM | Admit: 2015-11-21 | Discharge: 2015-11-21 | Disposition: A | Discharge status: Home / Self Care | Payer: Commercial Managed Care - HMO | Attending: Emergency Medicine | Admitting: Emergency Medicine

## 2015-11-21 ENCOUNTER — Encounter (HOSPITAL_COMMUNITY): Payer: Self-pay | Admitting: Emergency Medicine

## 2015-11-21 ENCOUNTER — Emergency Department (HOSPITAL_COMMUNITY): Payer: Commercial Managed Care - HMO

## 2015-11-21 DIAGNOSIS — R319 Hematuria, unspecified: Secondary | ICD-10-CM | POA: Insufficient documentation

## 2015-11-21 DIAGNOSIS — R61 Generalized hyperhidrosis: Secondary | ICD-10-CM | POA: Diagnosis not present

## 2015-11-21 DIAGNOSIS — I10 Essential (primary) hypertension: Secondary | ICD-10-CM | POA: Diagnosis not present

## 2015-11-21 DIAGNOSIS — Z79899 Other long term (current) drug therapy: Secondary | ICD-10-CM | POA: Diagnosis not present

## 2015-11-21 DIAGNOSIS — K5900 Constipation, unspecified: Secondary | ICD-10-CM | POA: Diagnosis not present

## 2015-11-21 DIAGNOSIS — R110 Nausea: Secondary | ICD-10-CM | POA: Insufficient documentation

## 2015-11-21 DIAGNOSIS — R1031 Right lower quadrant pain: Secondary | ICD-10-CM | POA: Diagnosis present

## 2015-11-21 DIAGNOSIS — R109 Unspecified abdominal pain: Secondary | ICD-10-CM

## 2015-11-21 LAB — COMPREHENSIVE METABOLIC PANEL
ALBUMIN: 3.5 g/dL (ref 3.5–5.0)
ALT: 12 U/L — ABNORMAL LOW (ref 14–54)
ANION GAP: 6 (ref 5–15)
AST: 16 U/L (ref 15–41)
Alkaline Phosphatase: 106 U/L (ref 38–126)
BUN: 17 mg/dL (ref 6–20)
CHLORIDE: 102 mmol/L (ref 101–111)
CO2: 29 mmol/L (ref 22–32)
Calcium: 8.8 mg/dL — ABNORMAL LOW (ref 8.9–10.3)
Creatinine, Ser: 1.18 mg/dL — ABNORMAL HIGH (ref 0.44–1.00)
GFR calc Af Amer: 55 mL/min — ABNORMAL LOW (ref >60)
GFR calc non Af Amer: 48 mL/min — ABNORMAL LOW (ref >60)
GLUCOSE: 112 mg/dL — AB (ref 65–99)
POTASSIUM: 3.8 mmol/L (ref 3.5–5.1)
SODIUM: 137 mmol/L (ref 135–145)
Total Bilirubin: 1 mg/dL (ref 0.3–1.2)
Total Protein: 7.8 g/dL (ref 6.5–8.1)

## 2015-11-21 LAB — CBC WITH DIFFERENTIAL/PLATELET
BASOS ABS: 0 10*3/uL (ref 0.0–0.1)
BASOS PCT: 0 %
EOS ABS: 0.1 10*3/uL (ref 0.0–0.7)
Eosinophils Relative: 1 %
HCT: 38.8 % (ref 36.0–46.0)
Hemoglobin: 12.7 g/dL (ref 12.0–15.0)
Lymphocytes Relative: 19 %
Lymphs Abs: 1.3 10*3/uL (ref 0.7–4.0)
MCH: 29.1 pg (ref 26.0–34.0)
MCHC: 32.7 g/dL (ref 30.0–36.0)
MCV: 89 fL (ref 78.0–100.0)
MONO ABS: 0.4 10*3/uL (ref 0.1–1.0)
Monocytes Relative: 6 %
NEUTROS ABS: 5.2 10*3/uL (ref 1.7–7.7)
NEUTROS PCT: 74 %
Platelets: 250 10*3/uL (ref 150–400)
RBC: 4.36 MIL/uL (ref 3.87–5.11)
RDW: 12.8 % (ref 11.5–15.5)
WBC: 7.1 10*3/uL (ref 4.0–10.5)

## 2015-11-21 LAB — URINALYSIS, ROUTINE W REFLEX MICROSCOPIC
Bilirubin Urine: NEGATIVE
Glucose, UA: NEGATIVE mg/dL
KETONES UR: NEGATIVE mg/dL
NITRITE: NEGATIVE
PH: 8 (ref 5.0–8.0)
SPECIFIC GRAVITY, URINE: 1.02 (ref 1.005–1.030)

## 2015-11-21 LAB — URINE MICROSCOPIC-ADD ON

## 2015-11-21 LAB — LIPASE, BLOOD: Lipase: 19 U/L (ref 11–51)

## 2015-11-21 MED ORDER — DOCUSATE SODIUM 100 MG PO CAPS: 100.0000 mg | ORAL_CAPSULE | Freq: Two times a day (BID) | ORAL | Status: DC

## 2015-11-21 MED ORDER — ONDANSETRON HCL 4 MG PO TABS: 4.0000 mg | ORAL_TABLET | Freq: Four times a day (QID) | ORAL | Status: DC

## 2015-11-21 MED ORDER — HYDROMORPHONE HCL 1 MG/ML IJ SOLN
0.5000 mg | Freq: Once | INTRAMUSCULAR | Status: AC
  Administered 2015-11-21: 0.5 mg via INTRAVENOUS
  Filled 2015-11-21: qty 1

## 2015-11-21 MED ORDER — ONDANSETRON HCL 4 MG/2ML IJ SOLN
4.0000 mg | Freq: Once | INTRAMUSCULAR | Status: AC
  Administered 2015-11-21: 4 mg via INTRAVENOUS
  Filled 2015-11-21: qty 2

## 2015-11-21 MED ORDER — POLYETHYLENE GLYCOL 3350 17 G PO PACK: 17.0000 g | PACK | Freq: Every day | ORAL | Status: DC

## 2015-11-21 MED ORDER — SODIUM CHLORIDE 0.9 % IV BOLUS (SEPSIS)
1000.0000 mL | Freq: Once | INTRAVENOUS | Status: AC
  Administered 2015-11-21: 1000 mL via INTRAVENOUS

## 2015-11-21 NOTE — ED Notes (Signed)
Patient complaining of abdominal pain off and on for over a week. States she was treated here last week for same. Denies vomiting or diarrhea.

## 2015-11-21 NOTE — ED Provider Notes (Signed)
CSN: [id number]     Arrival date & time 11/21/15  0745 History  By signing my name below, I, Sandra Gross, attest that this documentation has been prepared under the direction and in the presence of Merrily Pew, MD. Electronically Signed: Eustaquio Gross, ED Scribe. 11/21/2015. 8:33 AM.   Chief Complaint  Patient presents with  . Abdominal Pain   The history is provided by the patient. No language interpreter was used.   HPI Comments: Sandra Gross is a 65 y.o. female who presents to the Emergency Department complaining of gradual onset, intermittent, bilateral lower abdominal pain x > 1 week. The pain lasts 5-10 minutes before subsiding on its own. Pt states that the pain does not subside for too long before returning. She also complains of nausea, sweats, and constipation for the past 3 days. She mentions that this morning after urinating she noticed blood on the toilet paper, causing her concern. She has been taking Hydrocodone which is prescribed for her leg with some relief. Pt was seen in the ED 1 week ago for similar symptoms. Pt had an appointment with her PCP today for follow up but decided to come to the ED instead due to noticing the blood on the toilet paper. Denies dysuria, vomiting, fever, chills, rash, diarrhea, leg swelling, or any other associated symptoms. No hx kidney stones.   Past Medical History  Diagnosis Date  . Hiatal hernia   . Hypertension   . High cholesterol   . Aneurysm ([place])     pt states 2 plus years ago   Past Surgical History  Procedure Laterality Date  . Brain surgery    . Cesarean section    . Bladder surgery     History reviewed. No pertinent family history. Social History  Substance Use Topics  . Smoking status: Never Smoker   . Smokeless tobacco: None  . Alcohol Use: Yes     Comment: socially   OB History    No data available     Review of Systems  Constitutional: Positive for diaphoresis. Negative for fever and chills.   Gastrointestinal: Positive for nausea, abdominal pain and constipation. Negative for vomiting and diarrhea.  Genitourinary: Negative for dysuria.  All other systems reviewed and are negative.  Allergies  Review of patient's allergies indicates no known allergies.  Home Medications   Prior to Admission medications   Medication Sig Start Date End Date Taking? Authorizing Provider  amLODipine (NORVASC) 5 MG tablet Take 5 mg by mouth daily.   Yes Historical Provider, MD  losartan-hydrochlorothiazide (HYZAAR) 100-25 MG tablet Take 1 tablet by mouth daily. 05/12/15  Yes Historical Provider, MD  meloxicam (MOBIC) 15 MG tablet Take 15 mg by mouth daily as needed for pain.  05/12/15  Yes Historical Provider, MD  omeprazole (PRILOSEC) 20 MG capsule Take 1 capsule (20 mg total) by mouth daily. 11/14/15  Yes Sandra Essex, MD  potassium chloride (K-DUR) 10 MEQ tablet Take 20 mEq by mouth daily.   Yes Historical Provider, MD  ranitidine (ZANTAC) 300 MG tablet Take 300 mg by mouth daily. 05/12/15  Yes Historical Provider, MD  simvastatin (ZOCOR) 40 MG tablet Take 40 mg by mouth daily. 05/12/15  Yes Historical Provider, MD  docusate sodium (COLACE) 100 MG capsule Take 1 capsule (100 mg total) by mouth every 12 (twelve) hours. 11/21/15   Merrily Pew, MD  ondansetron (ZOFRAN) 4 MG tablet Take 1 tablet (4 mg total) by mouth every 6 (six) hours. 11/21/15   Brexley Cutshaw,  MD  polyethylene glycol (MIRALAX / GLYCOLAX) packet Take 17 g by mouth daily. 11/21/15   Merrily Pew, MD   BP 118/84 mmHg  Pulse 90  Temp(Src) 98.5 F (36.9 C) (Oral)  Resp 21  Ht 5' (1.524 m)  Wt 210 lb (95.255 kg)  BMI 41.01 kg/m2  SpO2 97%   Physical Exam  Constitutional: She is oriented to person, place, and time. She appears well-developed and well-nourished. No distress.  HENT:  Head: Normocephalic and atraumatic.  Eyes: Conjunctivae and EOM are normal.  Neck: Neck supple. No tracheal deviation present.  Cardiovascular:  Normal rate, regular rhythm and normal heart sounds.   Pulmonary/Chest: Effort normal. No respiratory distress.  Abdominal: Soft. There is tenderness. There is no CVA tenderness.  Tenderness in suprapubic area  Musculoskeletal: Normal range of motion.  Neurological: She is alert and oriented to person, place, and time.  Skin: Skin is warm and dry.  Psychiatric: She has a normal mood and affect. Her behavior is normal.  Nursing note and vitals reviewed.     ED Course  Procedures (including critical care time)  DIAGNOSTIC STUDIES: Oxygen Saturation is 94% on RA, adequate by my interpretation.    COORDINATION OF CARE: 8:30 AM-Discussed treatment plan which includes UA with pt at bedside and pt agreed to plan.   Labs Review Labs Reviewed  COMPREHENSIVE METABOLIC PANEL - Abnormal; Notable for the following:    Glucose, Bld 112 (*)    Creatinine, Ser 1.18 (*)    Calcium 8.8 (*)    ALT 12 (*)    GFR calc non Af Amer 48 (*)    GFR calc Af Amer 55 (*)    All other components within normal limits  URINALYSIS, ROUTINE W REFLEX MICROSCOPIC (NOT AT [facility]) - Abnormal; Notable for the following:    APPearance HAZY (*)    Hgb urine dipstick LARGE (*)    Protein, ur TRACE (*)    Leukocytes, UA SMALL (*)    All other components within normal limits  URINE MICROSCOPIC-ADD ON - Abnormal; Notable for the following:    Squamous Epithelial / LPF 0-5 (*)    Bacteria, UA RARE (*)    All other components within normal limits  CBC WITH DIFFERENTIAL/PLATELET  LIPASE, BLOOD    Imaging Review Ct Renal Stone Study  11/21/2015  CLINICAL DATA:  Bilateral lower pelvic pain for 2 weeks.  Hematuria. EXAM: CT ABDOMEN AND PELVIS WITHOUT CONTRAST TECHNIQUE: Multidetector CT imaging of the abdomen and pelvis was performed following the standard protocol without IV contrast. COMPARISON:  05/19/2015. FINDINGS: Lower chest: Lung bases are clear. No effusions. Heart is normal size. Hepatobiliary: Prior  cholecystectomy. No focal hepatic abnormality or biliary ductal dilatation. Pancreas: No focal abnormality or ductal dilatation. Spleen: No focal abnormality.  Normal size. Adrenals/Urinary Tract: No adrenal abnormality. No focal renal abnormality. No stones or hydronephrosis. Urinary bladder is unremarkable. Stomach/Bowel: Diffuse colonic diverticulosis. No active diverticulitis. Appendix is normal. Stomach and small bowel decompressed, unremarkable. Vascular/Lymphatic: No evidence of aneurysm or adenopathy. Reproductive: Uterus and adnexa unremarkable.  No mass. Other: No free fluid or free air. Musculoskeletal: No acute bony abnormality. IMPRESSION: Diffuse colonic diverticulosis.  No active diverticulitis. Normal appendix. No renal or ureteral stones.  No hydronephrosis. Electronically Signed   By: Rolm Baptise M.D.   On: 11/21/2015 10:58   I have personally reviewed and evaluated these images and lab results as part of my medical decision-making.   EKG Interpretation None  MDM   Final diagnoses:  Abdominal pain, unspecified abdominal location  Hematuria    usnure of exact causes of hematuria, No evidence of kidney stone or renal cell carcinoma on CT scan. This likely be followed further by her primary doctor for which she will try to schedule appointment within next couple days. May need a urology referral. Abdominal pain is likely related to decreased bowel movements as this seems spasmodic in nature. Her CT scan is without any evidence of obstruction or diverticulitis or other causes for this. It is likely related to the increased amount of hydrocodone she's been using recently. We'll start on MiraLAX and stool softeners and will titrate to effect.  New Prescriptions: New Prescriptions   DOCUSATE SODIUM (COLACE) 100 MG CAPSULE    Take 1 capsule (100 mg total) by mouth every 12 (twelve) hours.   ONDANSETRON (ZOFRAN) 4 MG TABLET    Take 1 tablet (4 mg total) by mouth every 6 (six)  hours.   POLYETHYLENE GLYCOL (MIRALAX / GLYCOLAX) PACKET    Take 17 g by mouth daily.    I have personally and contemperaneously reviewed labs and imaging and used in my decision making as above.   A medical screening exam was performed and I feel the patient has had an appropriate workup for their chief complaint at this time and likelihood of emergent condition existing is low and thus workup can continue on an outpatient basis.. Their vital signs are stable. They have been counseled on decision, discharge, follow up and which symptoms necessitate immediate return to the emergency department.  They verbally stated understanding and agreement with plan and discharged in stable condition.     I personally performed the services described in this documentation, which was scribed in my presence. The recorded information has been reviewed and is accurate.      Merrily Pew, MD 11/21/15 1147

## 2016-06-19 ENCOUNTER — Encounter: Payer: Self-pay | Admitting: Gastroenterology

## 2016-07-13 ENCOUNTER — Emergency Department (HOSPITAL_COMMUNITY)
Admission: EM | Admit: 2016-07-13 | Discharge: 2016-07-13 | Disposition: A | Discharge status: Home / Self Care | Payer: Medicare Other | Attending: Emergency Medicine | Admitting: Emergency Medicine

## 2016-07-13 ENCOUNTER — Emergency Department (HOSPITAL_COMMUNITY): Payer: Medicare Other

## 2016-07-13 ENCOUNTER — Encounter (HOSPITAL_COMMUNITY): Payer: Self-pay | Admitting: Emergency Medicine

## 2016-07-13 DIAGNOSIS — R1030 Lower abdominal pain, unspecified: Secondary | ICD-10-CM | POA: Insufficient documentation

## 2016-07-13 DIAGNOSIS — I10 Essential (primary) hypertension: Secondary | ICD-10-CM | POA: Diagnosis not present

## 2016-07-13 DIAGNOSIS — Z79899 Other long term (current) drug therapy: Secondary | ICD-10-CM | POA: Insufficient documentation

## 2016-07-13 LAB — COMPREHENSIVE METABOLIC PANEL
ALT: 14 U/L (ref 14–54)
AST: 25 U/L (ref 15–41)
Albumin: 3.9 g/dL (ref 3.5–5.0)
Alkaline Phosphatase: 109 U/L (ref 38–126)
Anion gap: 7 (ref 5–15)
BUN: 20 mg/dL (ref 6–20)
CHLORIDE: 110 mmol/L (ref 101–111)
CO2: 25 mmol/L (ref 22–32)
Calcium: 9.1 mg/dL (ref 8.9–10.3)
Creatinine, Ser: 1.18 mg/dL — ABNORMAL HIGH (ref 0.44–1.00)
GFR, EST AFRICAN AMERICAN: 55 mL/min — AB (ref >60)
GFR, EST NON AFRICAN AMERICAN: 47 mL/min — AB (ref >60)
Glucose, Bld: 86 mg/dL (ref 65–99)
POTASSIUM: 3.8 mmol/L (ref 3.5–5.1)
Sodium: 142 mmol/L (ref 135–145)
Total Bilirubin: 0.7 mg/dL (ref 0.3–1.2)
Total Protein: 7.6 g/dL (ref 6.5–8.1)

## 2016-07-13 LAB — CBC
HEMATOCRIT: 39.1 % (ref 36.0–46.0)
HEMOGLOBIN: 13.3 g/dL (ref 12.0–15.0)
MCH: 30.2 pg (ref 26.0–34.0)
MCHC: 34 g/dL (ref 30.0–36.0)
MCV: 88.9 fL (ref 78.0–100.0)
Platelets: 236 10*3/uL (ref 150–400)
RBC: 4.4 MIL/uL (ref 3.87–5.11)
RDW: 13.8 % (ref 11.5–15.5)
WBC: 4 10*3/uL (ref 4.0–10.5)

## 2016-07-13 LAB — LIPASE, BLOOD: LIPASE: 19 U/L (ref 11–51)

## 2016-07-13 LAB — URINALYSIS, ROUTINE W REFLEX MICROSCOPIC
Bilirubin Urine: NEGATIVE
GLUCOSE, UA: NEGATIVE mg/dL
Hgb urine dipstick: NEGATIVE
Ketones, ur: NEGATIVE mg/dL
Leukocytes, UA: NEGATIVE
Nitrite: NEGATIVE
PH: 7 (ref 5.0–8.0)
PROTEIN: NEGATIVE mg/dL
Specific Gravity, Urine: 1.015 (ref 1.005–1.030)

## 2016-07-13 MED ORDER — HYDROMORPHONE HCL 1 MG/ML IJ SOLN
INTRAMUSCULAR | Status: AC
  Filled 2016-07-13: qty 1

## 2016-07-13 MED ORDER — HYDROMORPHONE HCL 1 MG/ML IJ SOLN
0.5000 mg | Freq: Once | INTRAMUSCULAR | Status: AC
  Administered 2016-07-13: 0.5 mg via INTRAVENOUS

## 2016-07-13 MED ORDER — IOPAMIDOL (ISOVUE-300) INJECTION 61%
100.0000 mL | Freq: Once | INTRAVENOUS | Status: AC | PRN
  Administered 2016-07-13: 100 mL via INTRAVENOUS

## 2016-07-13 MED ORDER — MORPHINE SULFATE (PF) 4 MG/ML IV SOLN
4.0000 mg | Freq: Once | INTRAVENOUS | Status: DC
  Filled 2016-07-13: qty 1

## 2016-07-13 MED ORDER — HYDROMORPHONE HCL 1 MG/ML IJ SOLN: 1.0000 mg | Freq: Once | INTRAMUSCULAR | Status: DC

## 2016-07-13 NOTE — ED Notes (Signed)
Pt reports that she cannot remember if she took her htn med this am Family at bedside

## 2016-07-13 NOTE — ED Notes (Signed)
Dr Venora Maples in to assess

## 2016-07-13 NOTE — ED Notes (Signed)
Pt contrast in - awaiting CT

## 2016-07-13 NOTE — ED Provider Notes (Signed)
[place] DEPT Provider Note   CSN: [id number] Arrival date & time: 07/13/16  1101  By signing my name below, I, Hansel Feinstein, attest that this documentation has been prepared under the direction and in the presence of Jola Schmidt, MD. Electronically Signed: Hansel Feinstein, ED Scribe. 07/13/16. 12:46 PM.     History   Chief Complaint Chief Complaint  Patient presents with  . Abdominal Pain    tumor removal 5 months removed    HPI Solangel Lotts is a 66 y.o. female with h/o uterine fibroid removal 04/2016 who presents to the Emergency Department complaining of moderate, ongoing, intermittent diffuse lower abdominal pain for over a month with associated intermittent nausea. Pt was hospitalized at [facility] in 05/2016 for an abdominal infection (pt does not recall if this was at her surgical site) and was placed on IV abx. Pt states she has not consulted with her surgeon about her recent increase in abdominal discomfort. Pt also reports urinary frequency that began last night. Pt denies taking OTC medications at home to improve symptoms. No worsening factors noted. Pt denies vomiting, dysuria.   PCP- Dr. Legrand Rams   The history is provided by the patient. No language interpreter was used.    Past Medical History:  Diagnosis Date  . Aneurysm ([place])    pt states 2 plus years ago  . Hiatal hernia   . High cholesterol   . Hypertension     Patient Active Problem List   Diagnosis Date Noted  . Colitis 05/19/2015  . HTN (hypertension) 05/19/2015    Past Surgical History:  Procedure Laterality Date  . BLADDER SURGERY    . BRAIN SURGERY    . CESAREAN SECTION      OB History    No data available       Home Medications    Prior to Admission medications   Medication Sig Start Date End Date Taking? Authorizing Provider  amLODipine (NORVASC) 5 MG tablet Take 5 mg by mouth daily.   Yes Historical Provider, MD  etodolac (LODINE) 300 MG capsule Take 300 mg by mouth 2 (two) times  daily.   Yes Historical Provider, MD  losartan-hydrochlorothiazide (HYZAAR) 100-25 MG tablet Take 1 tablet by mouth daily. 05/12/15  Yes Historical Provider, MD  meloxicam (MOBIC) 15 MG tablet Take 15 mg by mouth daily as needed for pain.  05/12/15  Yes Historical Provider, MD  omeprazole (PRILOSEC) 20 MG capsule Take 1 capsule (20 mg total) by mouth daily. Patient taking differently: Take 40 mg by mouth daily.  11/14/15  Yes Ezequiel Essex, MD  potassium chloride (K-DUR) 10 MEQ tablet Take 20 mEq by mouth daily.   Yes Historical Provider, MD  ranitidine (ZANTAC) 300 MG tablet Take 300 mg by mouth daily. 05/12/15  Yes Historical Provider, MD  simvastatin (ZOCOR) 40 MG tablet Take 40 mg by mouth daily. 05/12/15  Yes Historical Provider, MD    Family History Family history noncontributory  Social History Social History  Substance Use Topics  . Smoking status: Never Smoker  . Smokeless tobacco: Never Used  . Alcohol use Yes     Comment: socially     Allergies   Patient has no known allergies.   Review of Systems Review of Systems A complete 10 system review of systems was obtained and all systems are negative except as noted in the HPI and PMH.    Physical Exam Updated Vital Signs BP 157/99 (BP Location: Left Arm)   Pulse 77   Temp  98.2 F (36.8 C) (Oral)   Resp 18   Ht 5\' 1"  (1.549 m)   Wt 176 lb (79.8 kg)   SpO2 98%   BMI 33.25 kg/m   Physical Exam  Constitutional: She is oriented to person, place, and time. She appears well-developed and well-nourished. No distress.  HENT:  Head: Normocephalic and atraumatic.  Eyes: EOM are normal.  Neck: Normal range of motion.  Cardiovascular: Normal rate, regular rhythm and normal heart sounds.   Pulmonary/Chest: Effort normal and breath sounds normal. She has no wheezes. She has no rales.  Abdominal: Soft. She exhibits no distension. There is no tenderness.  Musculoskeletal: Normal range of motion.  Neurological: She is alert  and oriented to person, place, and time.  Skin: Skin is warm and dry.  Psychiatric: She has a normal mood and affect. Judgment normal.  Nursing note and vitals reviewed.    ED Treatments / Results   DIAGNOSTIC STUDIES: Oxygen Saturation is 99% on RA, normal by my interpretation.    COORDINATION OF CARE: 12:43 PM Discussed treatment plan with pt at bedside which includes UA, lab work, CT A/P and pt agreed to plan.    Labs (all labs ordered are listed, but only abnormal results are displayed) Labs Reviewed  COMPREHENSIVE METABOLIC PANEL - Abnormal; Notable for the following:       Result Value   Creatinine, Ser 1.18 (*)    GFR calc non Af Amer 47 (*)    GFR calc Af Amer 55 (*)    All other components within normal limits  URINALYSIS, ROUTINE W REFLEX MICROSCOPIC - Abnormal; Notable for the following:    Color, Urine STRAW (*)    All other components within normal limits  LIPASE, BLOOD  CBC    EKG  EKG Interpretation None       Radiology Ct Abdomen Pelvis W Contrast  Result Date: 07/13/2016 CLINICAL DATA:  One month history of lower abdominal pain EXAM: CT ABDOMEN AND PELVIS WITH CONTRAST TECHNIQUE: Multidetector CT imaging of the abdomen and pelvis was performed using the standard protocol following bolus administration of intravenous contrast. CONTRAST:  193mL ISOVUE-300 IOPAMIDOL (ISOVUE-300) INJECTION 61% COMPARISON:  11/21/2015 FINDINGS: Lower chest:  Unremarkable. Hepatobiliary: No focal abnormality within the liver parenchyma. Gallbladder is surgically absent. Mild prominence intrahepatic bile ducts with mild distention extrahepatic bile ducts, likely related to prior cholecystectomy. Pancreas: No focal mass lesion. No dilatation of the main duct. No intraparenchymal cyst. No peripancreatic edema. Spleen: No splenomegaly. No focal mass lesion. Adrenals/Urinary Tract: No adrenal nodule or mass. No enhancing lesion in either kidney. No hydroureteronephrosis. The urinary  bladder appears normal for the degree of distention. Stomach/Bowel: Tiny hiatal hernia. Stomach otherwise unremarkable. Duodenum is normally positioned as is the ligament of Treitz. No small bowel wall thickening. No small bowel dilatation. The terminal ileum is normal. The appendix is normal. Diverticuli are seen scattered along the entire length of the colon without CT findings of diverticulitis. Vascular/Lymphatic: No abdominal aortic aneurysm. There is no gastrohepatic or hepatoduodenal ligament lymphadenopathy. No intraperitoneal or retroperitoneal lymphadenopathy. No pelvic sidewall lymphadenopathy. Reproductive: Uterus mildly prominent at 13.5 x 4.8 x 7.1 cm. 3.4 cm intramural fibroid identified posterior uterine body. Probable 12 mm submucosal fibroid identified posteriorly towards the fundus. There is no adnexal mass. Other: No intraperitoneal free fluid. Musculoskeletal: Bone windows reveal no worrisome lytic or sclerotic osseous lesions. IMPRESSION: 1. No acute findings in the abdomen or pelvis. 2. Fibroid change in the uterus. 3. Tiny hiatal  hernia. 4. Status post cholecystectomy. Electronically Signed   By: Misty Stanley M.D.   On: 07/13/2016 15:47    Procedures Procedures (including critical care time)  Medications Ordered in ED Medications  HYDROmorphone (DILAUDID) 1 MG/ML injection (not administered)  HYDROmorphone (DILAUDID) injection 0.5 mg (0.5 mg Intravenous Given 07/13/16 1302)  iopamidol (ISOVUE-300) 61 % injection 100 mL (100 mLs Intravenous Contrast Given 07/13/16 1525)     Initial Impression / Assessment and Plan / ED Course  I have reviewed the triage vital signs and the nursing notes.  Pertinent labs & imaging results that were available during my care of the patient were reviewed by me and considered in my medical decision making (see chart for details).     4:06 PM Patient feels much better this time.  Discharge home in good condition.  Primary care and gynecology  follow-up.  She understands return to the ER for new or worsening symptoms.  Final Clinical Impressions(s) / ED Diagnoses   Final diagnoses:  Lower abdominal pain    New Prescriptions New Prescriptions   No medications on file    I personally performed the services described in this documentation, which was scribed in my presence. The recorded information has been reviewed and is accurate.        Jola Schmidt, MD 07/13/16 [phone]

## 2016-07-13 NOTE — ED Notes (Signed)
Initially pt ordered morphine 4 mg- then family member reported that morphine made pt very ill but dilaudid helped her - Dr C informed and order changed to dilaudid- upon receiving dilaudid, pt stated that I t went straight to her stomach

## 2016-07-13 NOTE — ED Notes (Signed)
Awaiting EDP initial assessment

## 2016-07-13 NOTE — ED Triage Notes (Signed)
.  Abd pain for the last month - Seen in Jan at [facility] and has an appt with Dr Gala Romney on 2/21 Had tumors removed by [facility] in [place], 4-5 months ago- Decided to come today because of the discomfort

## 2016-07-13 NOTE — ED Notes (Signed)
Pt reports that she is a pt at the pain clinic in [place] but her meds are not filled until the 1st of the month- She is also a pt of Dr Legrand Rams who has arranged her appt with Dr Sydell Axon

## 2016-07-17 ENCOUNTER — Encounter: Payer: Self-pay | Admitting: Gastroenterology

## 2016-07-17 ENCOUNTER — Ambulatory Visit (INDEPENDENT_AMBULATORY_CARE_PROVIDER_SITE_OTHER): Payer: 59 | Admitting: Gastroenterology

## 2016-07-17 DIAGNOSIS — R110 Nausea: Secondary | ICD-10-CM | POA: Diagnosis not present

## 2016-07-17 DIAGNOSIS — K5900 Constipation, unspecified: Secondary | ICD-10-CM

## 2016-07-17 MED ORDER — LINACLOTIDE 72 MCG PO CAPS: 72.0000 ug | ORAL_CAPSULE | Freq: Every day | ORAL | 3 refills | Status: DC

## 2016-07-17 MED ORDER — ONDANSETRON HCL 4 MG PO TABS: 4.0000 mg | ORAL_TABLET | Freq: Three times a day (TID) | ORAL | 1 refills | Status: DC | PRN

## 2016-07-17 NOTE — Addendum Note (Signed)
Addended by: Annitta Needs on: 07/17/2016 09:34 AM   Modules accepted: Orders

## 2016-07-17 NOTE — Assessment & Plan Note (Signed)
Likely contributing to lower abdominal discomfort. Also notable she has a history of fibroids and attributes this discomfort very similar to prior fibroid issues that required surgical intervention. Will be seeing GYN soon. Will start Linzess 72 mcg once daily. Check ifobt, as it is unclear when she last had a colonoscopy. Most recent CT reassuring without acute etiology but did note fibroids. Return in 6 weeks regardless.

## 2016-07-17 NOTE — Patient Instructions (Signed)
For your bowel habits: I would like for you to try samples of Linzess. Take 1 capsule each morning, 30 minutes before breakfast. You may have some loose stool for the first few days, but it should get better. If not, call me. Let me know how this works for you. We can always increase the dosage or try something else.  Continue the Prilosec you are taking. For nausea: I have sent in Zofran to take every 8 hours as needed.  Definitely see a gynecologist. Also, if you need a referral to a counselor, let me know.  Please complete the stool sample to check for blood we can't see. If it is positive, we will do a colonoscopy.  I will see you in 6 weeks!

## 2016-07-17 NOTE — Progress Notes (Signed)
cc'ed to pcp °

## 2016-07-17 NOTE — Assessment & Plan Note (Signed)
Vague and associated with lower abdominal discomfort. No reflux exacerbations, no dysphagia, no alarm symptoms. Weight loss unintentional noted, so low threshold for colonoscopy or possible EGD if needed. Labs unremarkable. Fiance states "nervous stomach". Zofran prn ordered, continue PPI daily. Address constipation and fibroids. Return in 6 weeks.

## 2016-07-17 NOTE — Progress Notes (Signed)
Primary Care Physician:  Rosita Fire, MD Primary Gastroenterologist:  Dr. Oneida Alar   Chief Complaint  Patient presents with  . Abdominal Pain    HPI:   Sandra Gross is a 66 y.o. female presenting today at the request of Dr. Legrand Rams secondary to abdominal pain.   Alexander Bergeron is present with her today.   CT 07/13/16 without acute findings, fibroid changes in uterus, tiny hiatal hernia, s/p cholecystectomy. CBC, LFTs, lipase normal.   Occasional nausea. Up and down during the night at times. Lower abdominal discomfort chronically but not described as pain. Will have a "sick feeling come over" and have to put head by the window at night. No vomiting. Stool is hard and have to strain. Goes every other day.  No rectal bleeding. Thinks she had a colonoscopy but doesn't remember who or when. Thinks she was in [place]. Has a good appetite but eats a little bit at a time. Normally starts eating in the afternoon. Stomach gets upset (points to lower abdomen) and then feels sick. Has lost about 30 lbs over the past 6 months. Unintentional. No dysphagia.   States she felt like this prior to having fibroids removed. Trying to set up GYN appointment.   Past Medical History:  Diagnosis Date  . 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 Depression   . Hiatal hernia   . High cholesterol   . Hypertension   . Polyosteoarthritis     Past Surgical History:  Procedure Laterality Date  . BLADDER SURGERY    . [place]    . CHOLECYSTECTOMY    . COLONOSCOPY     remote past in [place], unsure who performed or when   . UTERINE FIBROID SURGERY     X 2    Current Outpatient Prescriptions  Medication Sig Dispense Refill  . amLODipine (NORVASC) 5 MG tablet Take 5 mg by mouth daily.    Marland Kitchen etodolac (LODINE) 300 MG capsule Take 300 mg by mouth 2 (two) times daily.    Marland Kitchen losartan-hydrochlorothiazide (HYZAAR) 100-25 MG tablet Take 1 tablet by mouth daily.      . meloxicam (MOBIC) 15 MG tablet Take 15 mg by mouth daily as needed for pain.     Marland Kitchen omeprazole (PRILOSEC) 40 MG capsule Take 40 mg by mouth daily.    . potassium chloride (K-DUR) 10 MEQ tablet Take 20 mEq by mouth daily.    . ranitidine (ZANTAC) 300 MG tablet Take 300 mg by mouth daily.    . simvastatin (ZOCOR) 40 MG tablet Take 40 mg by mouth daily.    . ondansetron (ZOFRAN) 4 MG tablet Take 1 tablet (4 mg total) by mouth every 8 (eight) hours as needed for nausea or vomiting. 60 tablet 1   No current facility-administered medications for this visit.     Allergies as of 07/17/2016  . (No Known Allergies)    Family History  Problem Relation Age of Onset  . Cancer Sister     unsure what type     Social History   Social History  . Marital status: Single    Spouse name: N/A  . Number of children: N/A  . Years of education: N/A   Occupational History  . Not on file.   Social History Main Topics  . Smoking status: Never Smoker  . Smokeless tobacco: Never Used  . Alcohol use Yes  Comment: socially  . Drug use: No  . Sexual activity: Not on file   Other Topics Concern  . Not on file   Social History Narrative  . No narrative on file    Review of Systems: As mentioned in HPI   Physical Exam: BP (!) 143/86   Pulse 80   Temp 97.4 F (36.3 C) (Oral)   Ht 5\' 1"  (1.549 m)   Wt 180 lb 9.6 oz (81.9 kg)   BMI 34.12 kg/m  General:   Alert and oriented. Pleasant and cooperative. Well-nourished and well-developed.  Head:  Normocephalic and atraumatic. Eyes:  Without icterus, sclera clear and conjunctiva pink.  Ears:  Normal auditory acuity. Nose:  No deformity, discharge,  or lesions. Mouth:  No deformity or lesions, oral mucosa pink.  Lungs:  Clear to auscultation bilaterally. No wheezes, rales, or rhonchi. No distress.  Heart:  S1, S2 present without murmurs appreciated.  Abdomen:  +BS, soft, non-tender and non-distended. No HSM noted. No guarding or rebound. No  masses appreciated.  Rectal:  Deferred  Msk:  Kyphosis, unable to stand up straight with lower extremities bowed and arthritic changes  Extremities:  Without  edema. Neurologic:  Alert and  oriented x4 Psych:  Alert and cooperative. Normal mood and affect.  Lab Results  Component Value Date   WBC 4.0 07/13/2016   HGB 13.3 07/13/2016   HCT 39.1 07/13/2016   MCV 88.9 07/13/2016   PLT 236 07/13/2016   Lab Results  Component Value Date   ALT 14 07/13/2016   AST 25 07/13/2016   ALKPHOS 109 07/13/2016   BILITOT 0.7 07/13/2016   Lab Results  Component Value Date   CREATININE 1.18 (H) 07/13/2016   BUN 20 07/13/2016   NA 142 07/13/2016   K 3.8 07/13/2016   CL 110 07/13/2016   CO2 25 07/13/2016

## 2016-08-28 ENCOUNTER — Telehealth: Payer: Self-pay | Admitting: Gastroenterology

## 2016-08-28 ENCOUNTER — Ambulatory Visit: Payer: 59 | Admitting: Gastroenterology

## 2016-08-28 ENCOUNTER — Encounter: Payer: Self-pay | Admitting: Gastroenterology

## 2016-08-28 NOTE — Telephone Encounter (Signed)
PATIENT WAS A NO SHOW AND LETTER WAS SENT  °

## 2016-10-17 NOTE — Progress Notes (Signed)
REVIEWED-NO ADDITIONAL RECOMMENDATIONS. 

## 2016-12-22 IMAGING — CT CT ABD-PELV W/ CM
2 of 5 series · 17 of 46 positions shown, 19 images · IV contrast (omnipaque)
Comparison: None.

CLINICAL DATA: Patient c/o low abd pain and urinary frequency x 3
days/ also c/o nausea; hx HTN, hiatal hernia

EXAM:
CT ABDOMEN AND PELVIS WITH CONTRAST
TECHNIQUE: Multidetector CT imaging of the abdomen and pelvis was performed
using the standard protocol following bolus administration of
intravenous contrast.
CONTRAST:  50mL OMNIPAQUE IOHEXOL 300 MG/ML SOLN, 100mL OMNIPAQUE
IOHEXOL 300 MG/ML SOLN

[Series 2: abd_pel_with 5.0 b40s · axial · 0.86mm/px · z∈[-330,+34]mm · 14 of 83 slices shown, 16 images]
[im 5/83  soft-tissue]
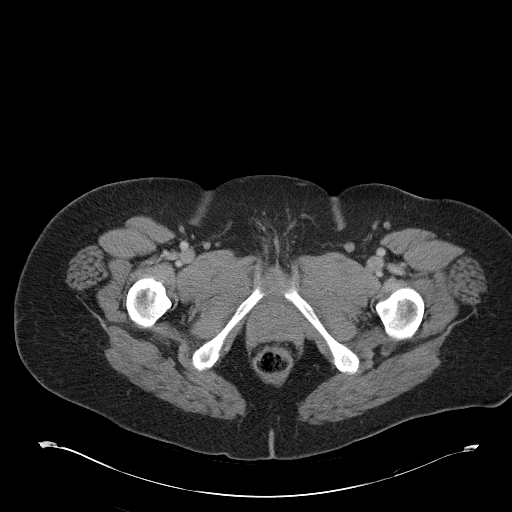
[im 5/83  bone]
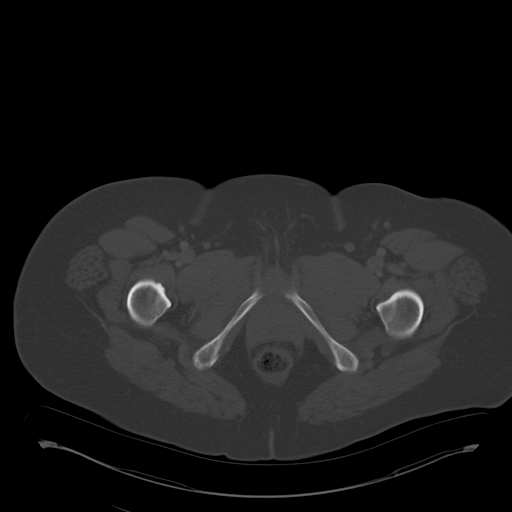
[im 9/83  soft-tissue]
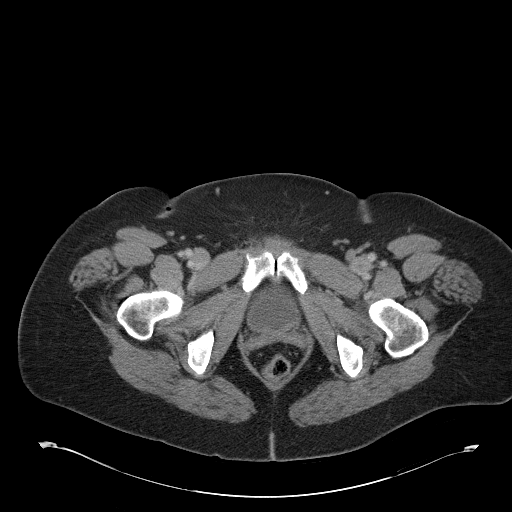
[im 18/83  soft-tissue]
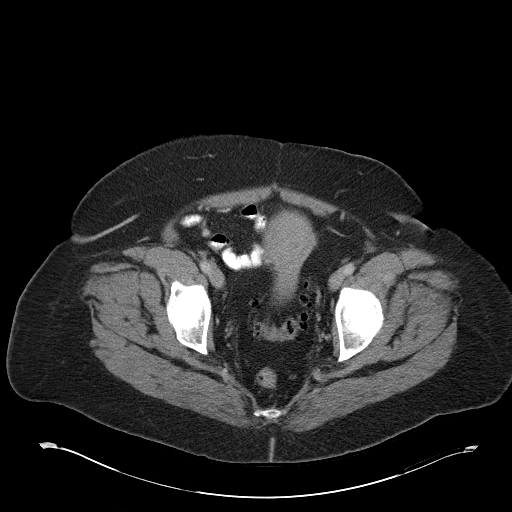
[im 22/83  soft-tissue]
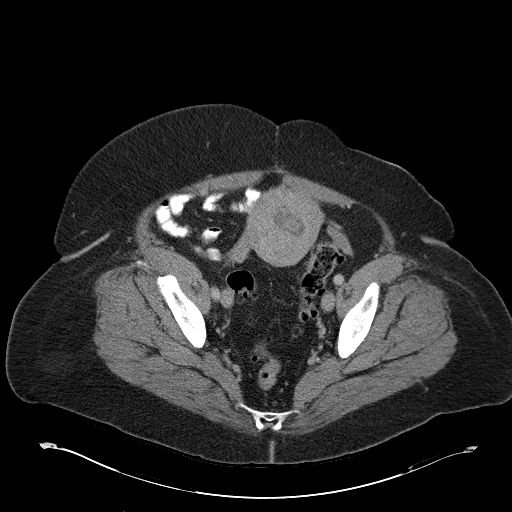
[im 26/83  soft-tissue]
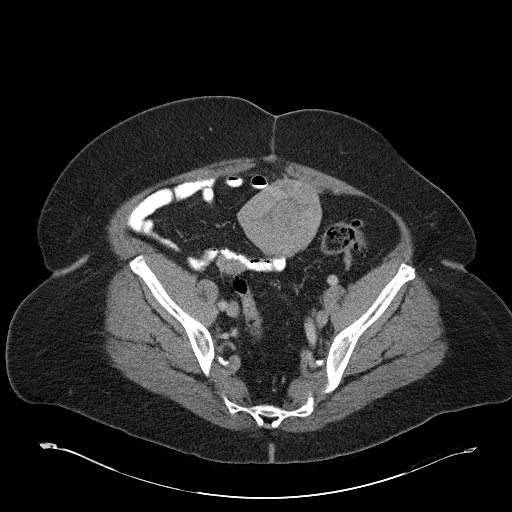
[im 35/83  soft-tissue]
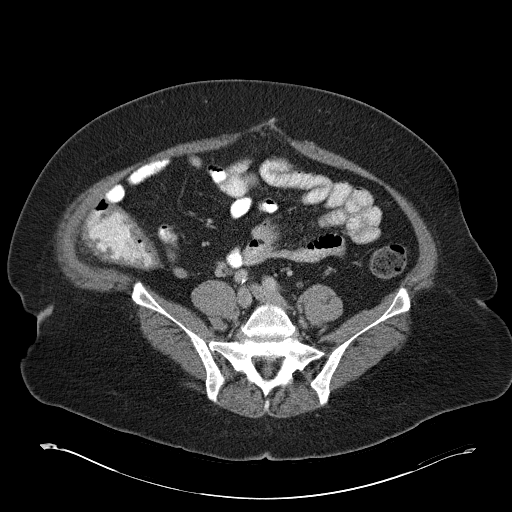
[im 39/83  soft-tissue]
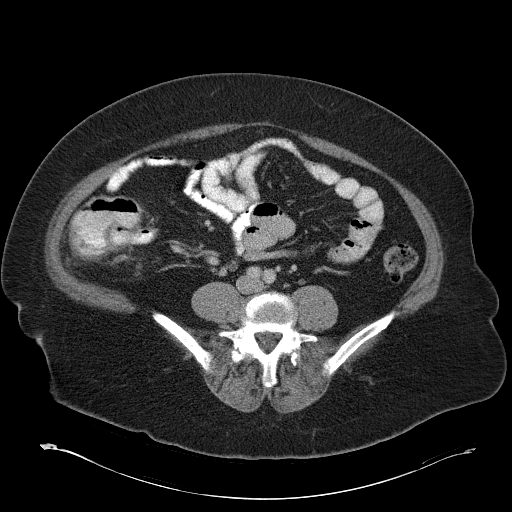
[im 44/83  soft-tissue]
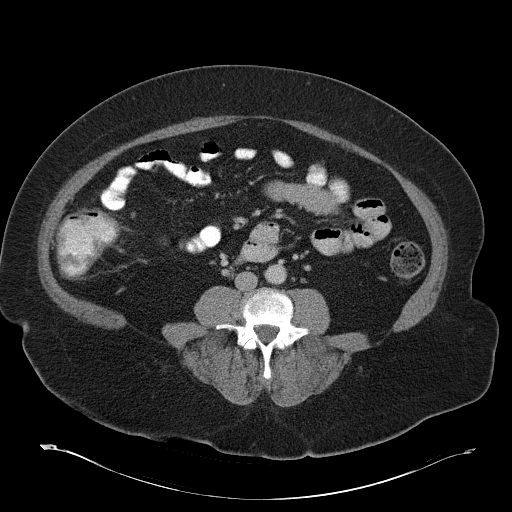
[im 48/83  soft-tissue]
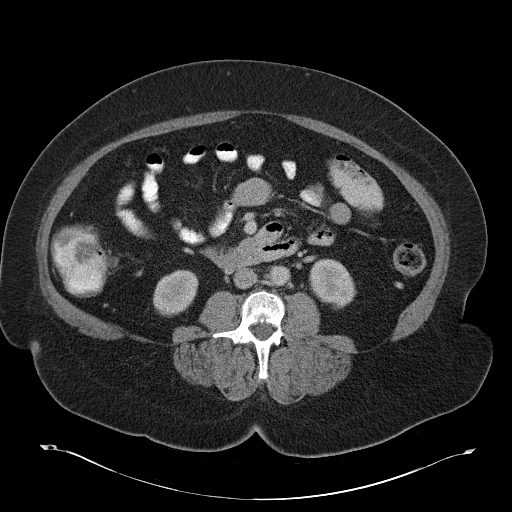
[im 48/83  bone]
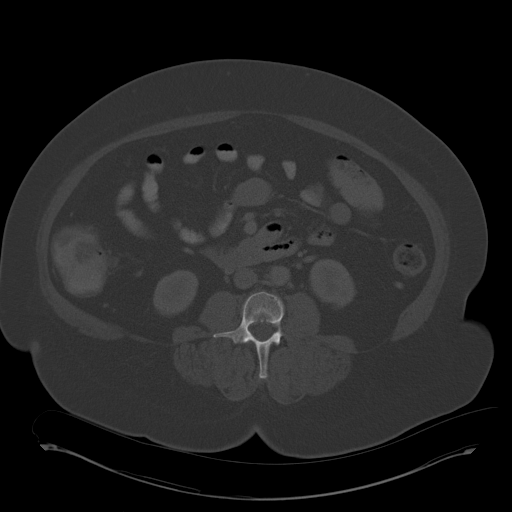
[im 57/83  soft-tissue]
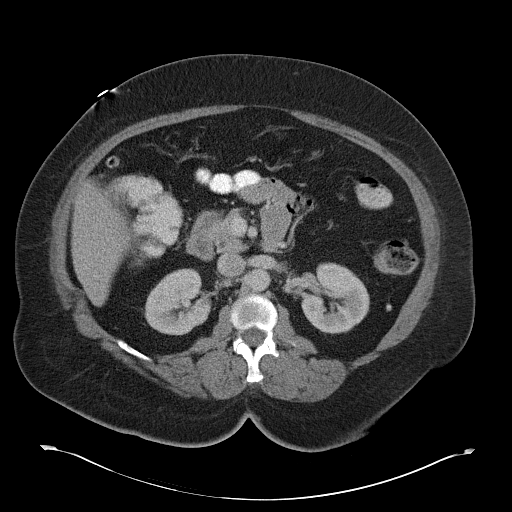
[im 61/83  soft-tissue]
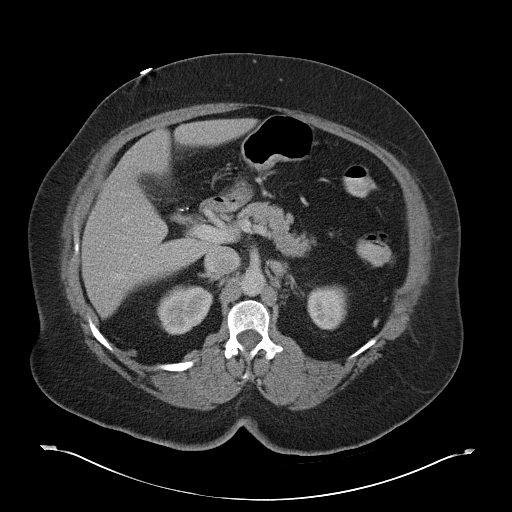
[im 65/83  soft-tissue]
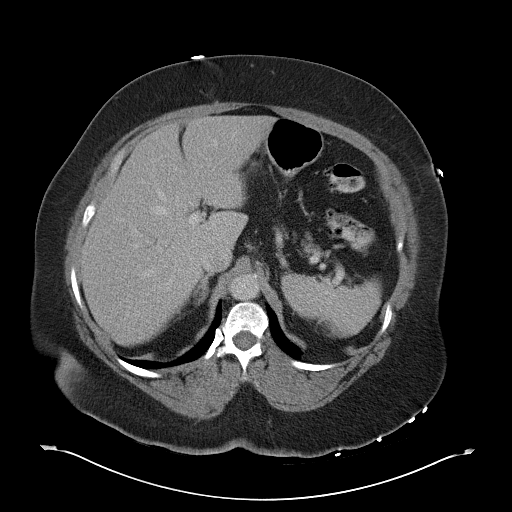
[im 74/83  soft-tissue]
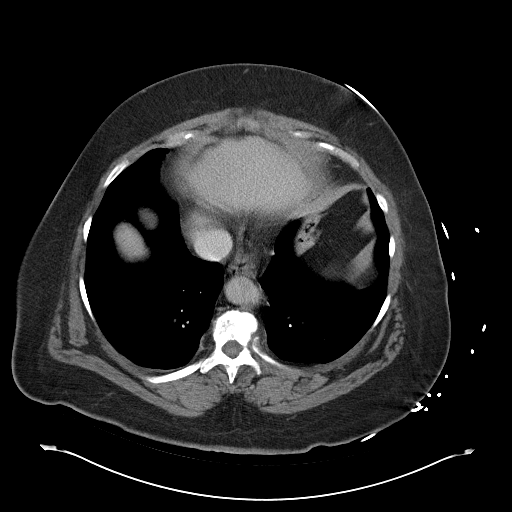
[im 78/83  soft-tissue]
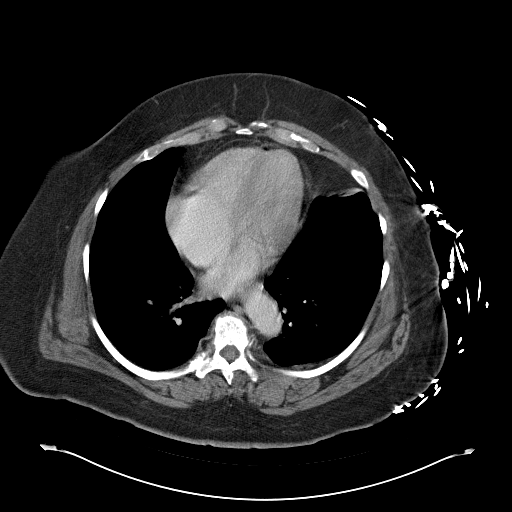

[Series 4: mpr cor post contrast (id) · coronal · 0.80mm/px · 3 of 102 slices shown]
[im 34/102  soft-tissue]
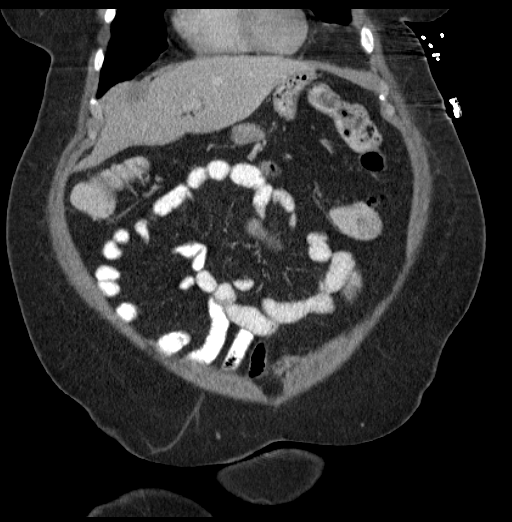
[im 45/102  soft-tissue]
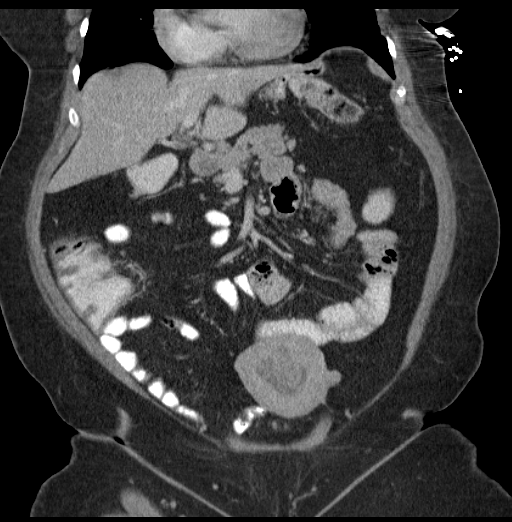
[im 57/102  soft-tissue]
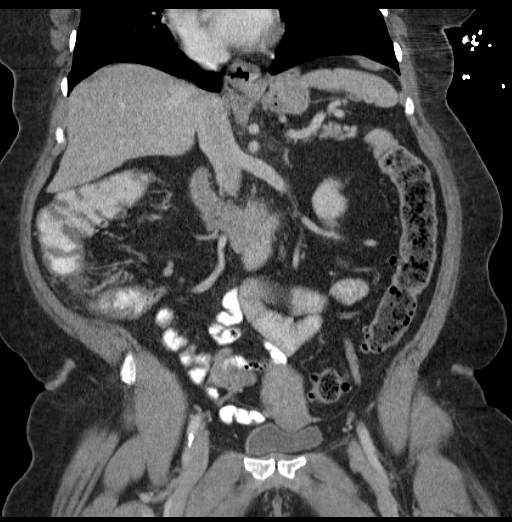

[17 of 46 positions shown; findings below may reference images not displayed]

FINDINGS: Visualized lung bases clear.

Surgical clips in the gallbladder fossa.

Unremarkable liver, spleen, adrenal glands, kidneys, pancreas,
portal vein, and abdominal aorta.

Small hiatal hernia. Stomach, small bowel, and colon are nondilated.
Normal appendix.

Mild wall thickening in the cecum with mild adjacent inflammatory/
edematous changes. Subcentimeter right mesenteric lymph nodes.
Scattered transverse, descending, and sigmoid diverticula without
significant adjacent inflammatory/ edematous change.

Prominent uterus, with Complex 3.6 cm masslike process centrally in
the endometrial cavity. Adnexal regions unremarkable. Urinary
bladder incompletely distended.

No ascites.  No free air.  No adenopathy.

Spondylitic changes in the visualized lower thoracic spine.
Bilateral sacroiliitis.
IMPRESSION: 1. Mild wall thickening in the proximal ascending colon with some
mild adjacent inflammatory/edematous changes suggesting colitis.
2. Masslike process in the endometrial cavity, may represent
degenerating submucosal fibroid versus endometrial neoplasm.
Recommend gynecologic follow-up.
3. Transverse, descending, and sigmoid diverticulosis.

## 2016-12-22 IMAGING — DX DG CHEST 2V
2 series · 2 of 2 positions shown · non-contrast
Comparison: None.

CLINICAL DATA: 64-year-old female with fever nausea and lower
abdominal pain x3 days.

EXAM:
CHEST  2 VIEW

[chest lat]
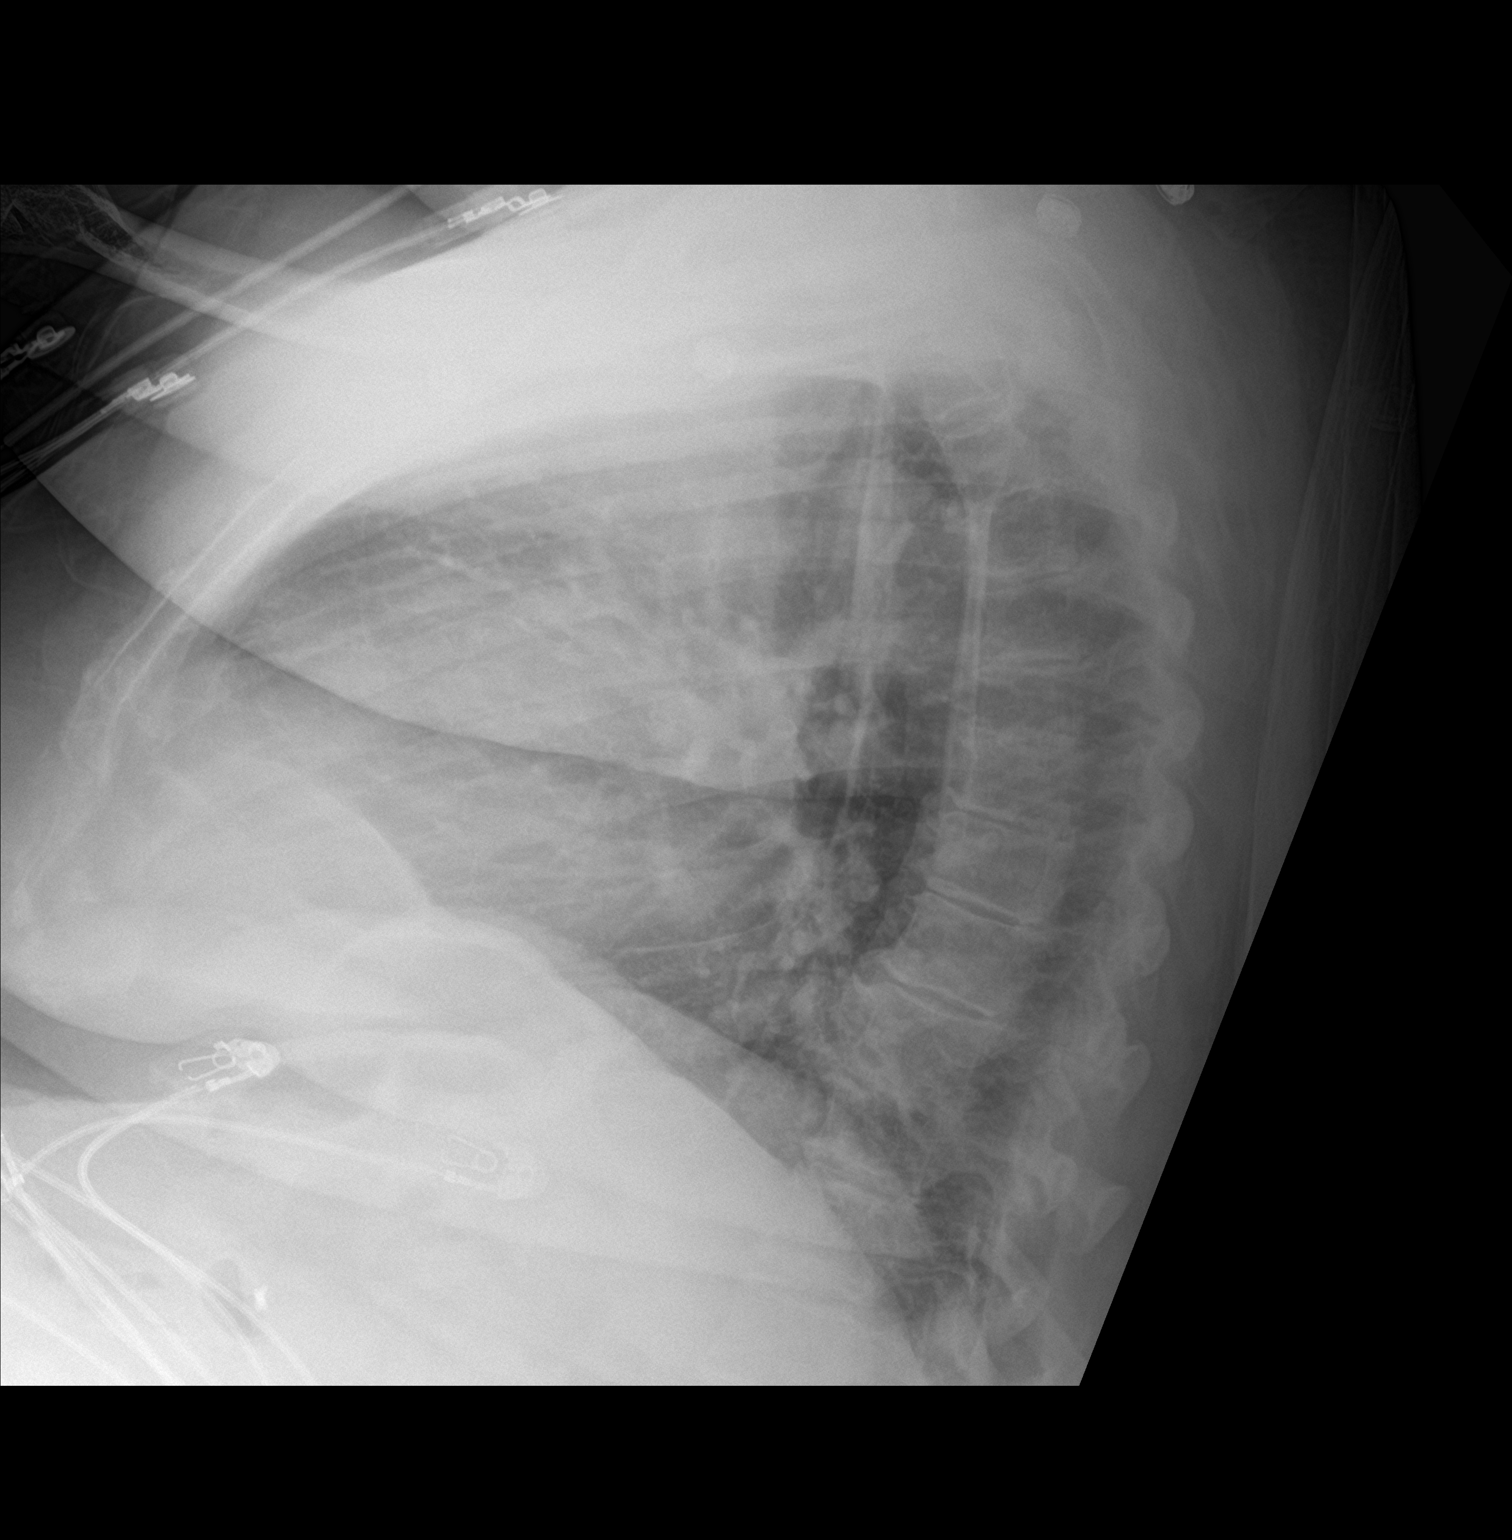

[chest ap]
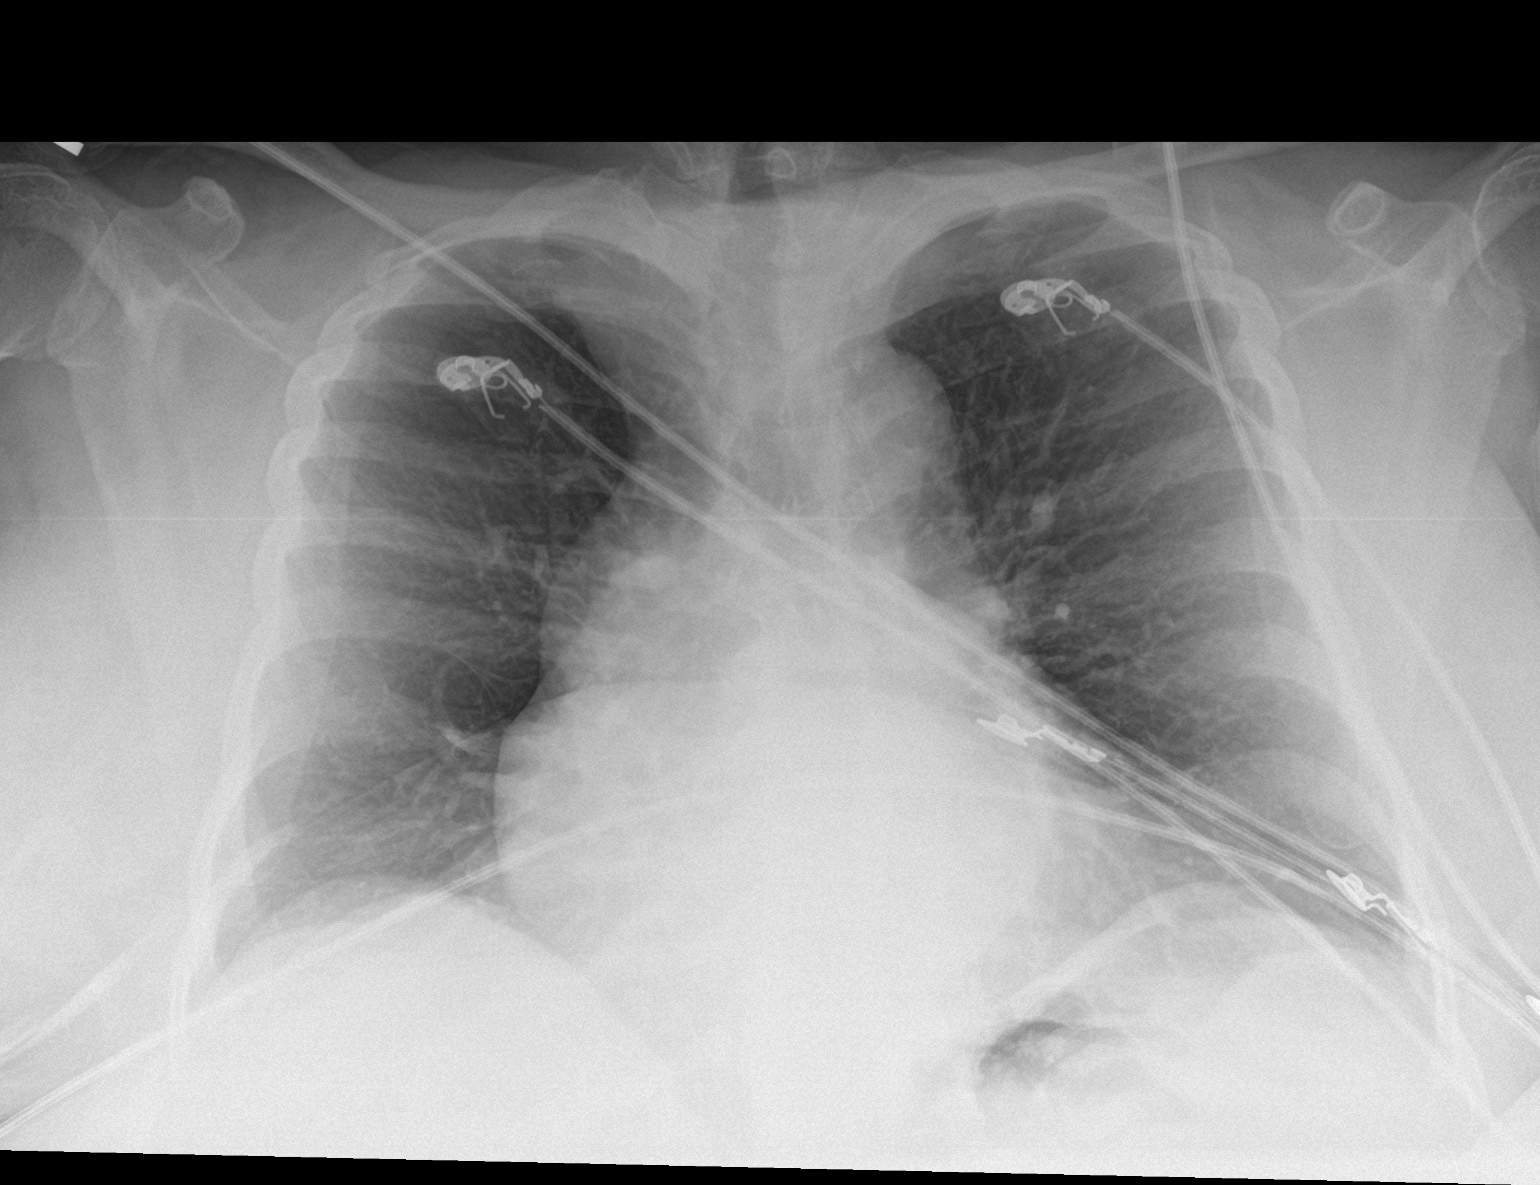

[2 of 2 positions shown; findings below may reference images not displayed]

FINDINGS: Two views of the chest do not demonstrate any focal consolidation.
There is no pleural effusion or pneumothorax. Mild cardiomegaly. The
osseous structures are grossly unremarkable.
IMPRESSION: No active cardiopulmonary disease.

## 2017-06-19 ENCOUNTER — Encounter: Payer: Self-pay | Admitting: Gastroenterology

## 2017-07-09 ENCOUNTER — Encounter: Payer: Self-pay | Admitting: Gastroenterology

## 2017-07-09 ENCOUNTER — Ambulatory Visit (INDEPENDENT_AMBULATORY_CARE_PROVIDER_SITE_OTHER): Payer: 59 | Admitting: Gastroenterology

## 2017-07-09 DIAGNOSIS — K5901 Slow transit constipation: Secondary | ICD-10-CM

## 2017-07-09 MED ORDER — LUBIPROSTONE 24 MCG PO CAPS: 24.0000 ug | ORAL_CAPSULE | Freq: Two times a day (BID) | ORAL | 11 refills | Status: DC

## 2017-07-09 NOTE — Progress Notes (Signed)
ON RECALL  °

## 2017-07-09 NOTE — Progress Notes (Signed)
CC'D TO PCP °

## 2017-07-09 NOTE — Progress Notes (Signed)
Subjective:    Patient ID: Sandra Gross, female    DOB: 1951-03-14, 67 y.o.   MRN: [id number]  Rosita Fire, MD  HPI CHANGE IN BOWEL IN HABITS: BOWELS DON'T MOVE. HARD TO COME OUT ITSELF. IF TAKES LAXATIVES AND FEELS LIKE IT STAYS IN THERE. WON'T LET IT ALL OUT. HAS TO PULL OUT WITH GLOVED HAND. CAN'T HALF EAT. SO MUCH PAIN . MISERABLE. WHEN SHE HAS TO PASS URINE AND MAKES HER SICK AFTER MAKES THE WATER AFTER SHE EMPTIES HER BLADDER. SEEN IN ED [facility]. NO XRAYS. NO ENEMAS. GOT ABX. WEIGHT LOSS: 213 LBS TO 191 LBS, LOST OVER FOR LAST YEAR. CONSTIPATION: BOWELS NEVER MOVED REGULAR, MAY SKIP DAYS. BOWELS MOVE: MEDS (LINZESS) TAKES AND BOWELS MOVE, BUT IT MAKES HER BELLY HURT. DON'T DRINK WATER. DRINKS SODA. FIBER: NOT SINCE HAD THIS PROBLEM. EATS AND THEN FEELS SICK. NAUSEA: ALL DAY. VOMITING: NO. ? BLACK TARRY STOOL-1 MO AGO. PAIN ALWAYS IN LLQ AND MOVES TO THE RIGHT. HAVING A BOWEL MOVEMENT EASES PAIN BUT DOESN'T RELIEVE IT. HEARTBURN: CONTROLLED. OTC MEDS FOR HA(EXCEDRIN). 4 VAGINAL BIRTHS: 1 C SECTION. ACCOMPANIED BY HER FINACEE.  PT DENIES FEVER, CHILLS, HEMATOCHEZIA, vomiting, melena, diarrhea, CHEST PAIN, SHORTNESS OF BREATH, problems swallowing, problems with sedation, OR heartburn or indigestion.  Past Medical History:  Diagnosis Date  . 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 Depression   . Hiatal hernia   . High cholesterol   . Hypertension   . Polyosteoarthritis    Past Surgical History:  Procedure Laterality Date  . BLADDER SURGERY(??)    . BRAIN SURGERY(ANEURYSM)  [place]    . CHOLECYSTECTOMY    . COLONOSCOPY     remote past in [place], unsure who performed or when   . UTERINE FIBROID SURGERY     X 2   No Known Allergies  Current Outpatient Medications  Medication Sig Dispense Refill  . amLODipine (NORVASC) 5 MG tablet Take 5 mg by mouth daily.    .      .      . losartan-hydrochlorothiazide (HYZAAR) 100-25 MG tablet Take 1 tablet by  mouth daily.    Marland Kitchen omeprazole (PRILOSEC) 40 MG capsule Take 40 mg by mouth daily.    . potassium chloride (K-DUR) 10 MEQ tablet Take 20 mEq by mouth daily.    . ranitidine (ZANTAC) 300 MG tablet Take 300 mg by mouth daily. PRN   . simvastatin (ZOCOR) 40 MG tablet Take 40 mg by mouth daily.    . meloxicam (MOBIC) 15 MG tablet Take 15 mg by mouth daily as needed for pain.     .       Review of Systems PER HPI OTHERWISE ALL SYSTEMS ARE NEGATIVE.    Objective:   Physical Exam  Constitutional: She is oriented to person, place, and time. She appears well-developed and well-nourished. No distress.  HENT:  Head: Normocephalic and atraumatic.  Mouth/Throat: Oropharynx is clear and moist. No oropharyngeal exudate.  Eyes: Pupils are equal, round, and reactive to light. No scleral icterus.  Neck: Normal range of motion. Neck supple.  Cardiovascular: Normal rate, regular rhythm and normal heart sounds.  Pulmonary/Chest: Effort normal and breath sounds normal. No respiratory distress.  Abdominal: Soft. Bowel sounds are normal. She exhibits no distension. There is no tenderness.  Musculoskeletal: She exhibits no edema.  Lymphadenopathy:    She has no cervical adenopathy.  Neurological: She is alert and oriented to  person, place, and time.  NO new FOCAL DEFICITS  Psychiatric:  ANXIOUS MOOD, flat affect  Vitals reviewed.     Assessment & Plan:

## 2017-07-09 NOTE — Assessment & Plan Note (Addendum)
SYMPTOMS NOT IDEALLY CONTROLLED.  I PERSONALLY REVIEWED THE CT WITH DR. Thornton Papas: No abnormality of the rectum/sigmoid diverticulosis. USE AMITIZA EVERY NIGHT FOR 7 DAYS THEN TWICE DAILY. IT TREATS CONSTIPATION BUT MAY CAUSE NAUSEA. DRINK WATER TO KEEP YOUR URINE LIGHT YELLOW. FOLLOW A HIGH FIBER DIET. AVOID ITEMS THAT CAUSE BLOATING & GAS.  HANDOUT GIVEN. Please CALL IN 3 weeks IF SYMPTOMS ARE NOT IMPROVED.  OBTAIN RECORDS FROM [facility]. FOLLOW UP IN 3 MOS.

## 2017-07-09 NOTE — Patient Instructions (Signed)
I PERSONALLY REVIEWED CT SCAN FEB 2018 WITH RADIOLOGY. THERE WAS No abnormality of the rectum OR HERNIA. YOU DO HAVE sigmoid diverticulosis.  USE AMITIZA EVERY NIGHT FOR 7 DAYS THEN TWICE DAILY. IT TREATS CONSTIPATION BUT MAY CAUSE NAUSEA.  DRINK WATER TO KEEP YOUR URINE LIGHT YELLOW.  FOLLOW A HIGH FIBER DIET. AVOID ITEMS THAT CAUSE BLOATING & GAS. SEE INFO BELOW.  Please CALL IN 3 weeks IF SYMPTOMS ARE NOT IMPROVED.   FOLLOW UP IN 3 MOS.   High-Fiber Diet A high-fiber diet changes your normal diet to include more whole grains, legumes, fruits, and vegetables. Changes in the diet involve replacing refined carbohydrates with unrefined foods. The calorie level of the diet is essentially unchanged. The Dietary Reference Intake (recommended amount) for adult males is 38 grams per day. For adult females, it is 25 grams per day. Pregnant and lactating women should consume 28 grams of fiber per day.Fiber is the intact part of a plant that is not broken down during digestion. Functional fiber is fiber that has been isolated from the plant to provide a beneficial effect in the body.  PURPOSE  Increase stool bulk.   Ease and regulate bowel movements.   Lower cholesterol.   REDUCE RISK OF COLON CANCER  INDICATIONS THAT YOU NEED MORE FIBER  Constipation and hemorrhoids.   Uncomplicated diverticulosis (intestine condition) and irritable bowel syndrome.   Weight management.   As a protective measure against hardening of the arteries (atherosclerosis), diabetes, and cancer.   GUIDELINES FOR INCREASING FIBER IN THE DIET  Start adding fiber to the diet slowly. A gradual increase of about 5 more grams (2 slices of whole-wheat bread, 2 servings of most fruits or vegetables, or 1 bowl of high-fiber cereal) per day is best. Too rapid an increase in fiber may result in constipation, flatulence, and bloating.   Drink enough water and fluids to keep your urine clear or pale yellow. Water, juice, or  caffeine-free drinks are recommended. Not drinking enough fluid may cause constipation.   Eat a variety of high-fiber foods rather than one type of fiber.   Try to increase your intake of fiber through using high-fiber foods rather than fiber pills or supplements that contain small amounts of fiber.   The goal is to change the types of food eaten. Do not supplement your present diet with high-fiber foods, but replace foods in your present diet.  INCLUDE A VARIETY OF FIBER SOURCES  Replace refined and processed grains with whole grains, canned fruits with fresh fruits, and incorporate other fiber sources. White rice, white breads, and most bakery goods contain little or no fiber.   Brown whole-grain rice, buckwheat oats, and many fruits and vegetables are all good sources of fiber. These include: broccoli, Brussels sprouts, cabbage, cauliflower, beets, sweet potatoes, white potatoes (skin on), carrots, tomatoes, eggplant, squash, berries, fresh fruits, and dried fruits.   Cereals appear to be the richest source of fiber. Cereal fiber is found in whole grains and bran. Bran is the fiber-rich outer coat of cereal grain, which is largely removed in refining. In whole-grain cereals, the bran remains. In breakfast cereals, the largest amount of fiber is found in those with "bran" in their names. The fiber content is sometimes indicated on the label.   You may need to include additional fruits and vegetables each day.   In baking, for 1 cup white flour, you may use the following substitutions:   1 cup whole-wheat flour minus 2 tablespoons.  1/2 cup white flour plus 1/2 cup whole-wheat flour.

## 2017-07-09 NOTE — Addendum Note (Signed)
Addended by: Danie Binder on: 07/09/2017 11:21 AM   Modules accepted: Orders

## 2017-07-30 ENCOUNTER — Ambulatory Visit: Payer: 59 | Admitting: Gastroenterology

## 2017-08-25 ENCOUNTER — Encounter: Payer: Self-pay | Admitting: Gastroenterology

## 2018-04-01 ENCOUNTER — Encounter: Payer: Self-pay | Admitting: *Deleted

## 2018-05-04 ENCOUNTER — Encounter: Payer: 59 | Admitting: Obstetrics & Gynecology

## 2018-06-11 ENCOUNTER — Encounter: Payer: Self-pay | Admitting: *Deleted

## 2018-06-25 ENCOUNTER — Encounter: Payer: 59 | Admitting: Advanced Practice Midwife

## 2018-06-25 ENCOUNTER — Ambulatory Visit (INDEPENDENT_AMBULATORY_CARE_PROVIDER_SITE_OTHER): Payer: 59 | Admitting: Obstetrics and Gynecology

## 2018-06-25 ENCOUNTER — Encounter: Payer: Self-pay | Admitting: Obstetrics and Gynecology

## 2018-06-25 ENCOUNTER — Other Ambulatory Visit (HOSPITAL_COMMUNITY)
Admission: RE | Admit: 2018-06-25 | Discharge: 2018-06-25 | Disposition: A | Discharge status: Home / Self Care | Payer: 59 | Source: Ambulatory Visit | Attending: Obstetrics and Gynecology | Admitting: Obstetrics and Gynecology

## 2018-06-25 VITALS — BP 142/80 | HR 96 | Temp 97.4°F | Ht 59.0 in | Wt 182.3 lb

## 2018-06-25 DIAGNOSIS — G8929 Other chronic pain: Secondary | ICD-10-CM | POA: Diagnosis present

## 2018-06-25 DIAGNOSIS — R109 Unspecified abdominal pain: Secondary | ICD-10-CM

## 2018-06-25 NOTE — Progress Notes (Signed)
Obstetrics and Gynecology New Patient Evaluation  Appointment Date: 06/25/2018  OBGYN Clinic: [facility]  Primary Care Provider: Rosita Fire  Referring Provider: Elenore Paddy, FNP [facility])  Chief Complaint: fibroids  History of Present Illness: Sandra Gross is a 68 y.o. African American G1P1 (LMP: unknown, ?5-6 years ago), seen for the above chief complaint.   Referral notes state consult reason is for fibroids but no imaging results or description of the fibroids. It does note some vague GI s/s that the patient has been having. The patient also endoreses vague GI s/s with issues with constipation and occasional abdominal discomfort and that she just doesn't feel right.    Patient states she was seen by a [facility] doctor in [place] and that she didn't want to go back to see him, but she states she had a surgery where she went to sleep and that it was surgery for her fibroids. She states she didn't have an abdominal incision.  She did have an CT scan in 2018 that noted a 13cm uterus.  She states that she had an ultrasound done at her PCP office last week.   She did see a GI doctor in 06/2017 and they recommened high fiber and amitiza  She denies any current pain or ever having any postmenopausal bleeding or spotting.   Review of Systems: as noted in the History of Present Illness.   Past Medical History:  Past Medical History:  Diagnosis Date  . 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 Depression   . Hiatal hernia   . High cholesterol   . Hypertension   . Polyosteoarthritis     Past Surgical History:  Past Surgical History:  Procedure Laterality Date  . BLADDER SURGERY    . [place]    . CHOLECYSTECTOMY    . COLONOSCOPY     remote past in [place], unsure who performed or when   . UTERINE FIBROID SURGERY     X 2    Past Obstetrical History:  OB History  Gravida Para Term Preterm AB Living  1          1  SAB TAB Ectopic Multiple Live Births          1    # Outcome Date GA Lbr Len/2nd Weight Sex Delivery Anes PTL Lv  1 Gravida            Cesarean section x 1  Past Gynecological History: As per HPI. History of Pap Smear(s): patient unsure Contraception: patient thinks she may have had a BTL at some point History of HRT use: No.   Social History:  Social History   Socioeconomic History  . Marital status: Single    Spouse name: Not on file  . Number of children: Not on file  . Years of education: Not on file  . Highest education level: Not on file  Occupational History  . Not on file  Social Needs  . Financial resource strain: Not on file  . Food insecurity:    Worry: Not on file    Inability: Not on file  . Transportation needs:    Medical: Not on file    Non-medical: Not on file  Tobacco Use  . Smoking status: Never Smoker  . Smokeless tobacco: Never Used  Substance and Sexual Activity  . Alcohol use: No    Frequency: Never  . Drug use: No  .  Sexual activity: Not on file  Lifestyle  . Physical activity:    Days per week: Not on file    Minutes per session: Not on file  . Stress: Not on file  Relationships  . Social connections:    Talks on phone: Not on file    Gets together: Not on file    Attends religious service: Not on file    Active member of club or organization: Not on file    Attends meetings of clubs or organizations: Not on file    Relationship status: Not on file  . Intimate partner violence:    Fear of current or ex partner: Not on file    Emotionally abused: Not on file    Physically abused: Not on file    Forced sexual activity: Not on file  Other Topics Concern  . Not on file  Social History Narrative  . Not on file    Family History:  Family History  Problem Relation Age of Onset  . Cancer Sister        unsure what type      Medications Leslie Andrea had no medications administered during this visit. Current  Outpatient Medications  Medication Sig Dispense Refill  . etodolac (LODINE) 300 MG capsule Take 300 mg by mouth 2 (two) times daily.    Marland Kitchen linaclotide (LINZESS) 72 MCG capsule Take 1 capsule (72 mcg total) by mouth daily before breakfast. (Patient taking differently: Take 72 mcg by mouth every other day. ) 90 capsule 3  . losartan-hydrochlorothiazide (HYZAAR) 100-25 MG tablet Take 1 tablet by mouth daily.    Marland Kitchen lubiprostone (AMITIZA) 24 MCG capsule Take 1 capsule (24 mcg total) by mouth 2 (two) times daily with a meal. 60 capsule 11  . meloxicam (MOBIC) 15 MG tablet Take 15 mg by mouth daily as needed for pain.     . potassium chloride (K-DUR) 10 MEQ tablet Take 20 mEq by mouth daily.    . simvastatin (ZOCOR) 40 MG tablet Take 40 mg by mouth daily.    Marland Kitchen amLODipine (NORVASC) 5 MG tablet Take 5 mg by mouth daily.    Marland Kitchen omeprazole (PRILOSEC) 40 MG capsule Take 40 mg by mouth daily.    . ondansetron (ZOFRAN) 4 MG tablet Take 1 tablet (4 mg total) by mouth every 8 (eight) hours as needed for nausea or vomiting. (Patient not taking: Reported on 07/09/2017) 60 tablet 1  . ranitidine (ZANTAC) 300 MG tablet Take 300 mg by mouth daily.     No current facility-administered medications for this visit.     Allergies Patient has no known allergies.   Physical Exam:  BP (!) 142/80   Pulse 96   Temp (!) 97.4 F (36.3 C)   Ht 4\' 11"  (1.499 m)   Wt 182 lb 4.8 oz (82.7 kg)   BMI 36.82 kg/m  Body mass index is 36.82 kg/m. General appearance: Well nourished, well developed female in no acute distress.  Respiratory:   Normal respiratory effort Abdomen: positive bowel sounds and no masses, hernias; diffusely non tender to palpation, non distended Neuro/Psych:  Normal mood and affect.  Skin:  Warm and dry.  Lymphatic:  No inguinal lymphadenopathy.   Pelvic exam: is not limited by body habitus EGBUS: within normal limits, Vagina: mild anterior and posterior vault prolapse. Vault is within normal limits and  with no blood or discharge in the vault, Cervix: normal appearing cervix without tenderness, discharge or lesions. Uterus:  enlarged, c/w 10-12  week size and non tender, mobile and Adnexa:  normal adnexa and no mass, fullness, tenderness Rectovaginal: deferred  Laboratory: none  Radiology: see HPI  Assessment: pt stable  Plan:  1. Chronic abdominal pain Pap done today. D/w her that I don't believe that her fibroids are the cause of her s/s. Records obtained after her visit and it looks like she saw a GYN in [place] (Dr. Barrie Dunker) in late 2019 for a return GYN visit and he prescribed her Femara to decrease her fibroid size but it sounds like she never started this in talking to her during the visit. It looks like they saw an u/s from 2018 that was consistent with her u/s that year with a 13cm uterus and some fibroids and a stripe of 68mm.    If s/s continue, recommend repeat u/s in 7m - Cytology - PAP  RTC PRN  Durene Romans MD Attending [facility])

## 2018-06-25 NOTE — Progress Notes (Signed)
Pt is feeling ill, states is nauseated & hot

## 2018-06-29 LAB — CYTOLOGY - PAP
Chlamydia: NEGATIVE
Diagnosis: NEGATIVE
HPV (WINDOPATH): NOT DETECTED
NEISSERIA GONORRHEA: NEGATIVE
Trichomonas: NEGATIVE

## 2018-12-18 ENCOUNTER — Emergency Department (HOSPITAL_COMMUNITY)
Admission: EM | Admit: 2018-12-18 | Discharge: 2018-12-18 | Disposition: A | Discharge status: Home / Self Care | Payer: 59 | Attending: Emergency Medicine | Admitting: Emergency Medicine

## 2018-12-18 ENCOUNTER — Other Ambulatory Visit: Payer: Self-pay

## 2018-12-18 ENCOUNTER — Encounter (HOSPITAL_COMMUNITY): Payer: Self-pay | Admitting: Emergency Medicine

## 2018-12-18 DIAGNOSIS — I10 Essential (primary) hypertension: Secondary | ICD-10-CM | POA: Diagnosis not present

## 2018-12-18 DIAGNOSIS — Z79899 Other long term (current) drug therapy: Secondary | ICD-10-CM | POA: Diagnosis not present

## 2018-12-18 DIAGNOSIS — R600 Localized edema: Secondary | ICD-10-CM | POA: Diagnosis present

## 2018-12-18 DIAGNOSIS — T783XXA Angioneurotic edema, initial encounter: Secondary | ICD-10-CM | POA: Insufficient documentation

## 2018-12-18 MED ORDER — DIPHENHYDRAMINE HCL 50 MG/ML IJ SOLN
25.0000 mg | Freq: Once | INTRAMUSCULAR | Status: AC
  Administered 2018-12-18: 25 mg via INTRAVENOUS
  Filled 2018-12-18: qty 1

## 2018-12-18 MED ORDER — METHYLPREDNISOLONE SODIUM SUCC 125 MG IJ SOLR
125.0000 mg | Freq: Once | INTRAMUSCULAR | Status: AC
  Administered 2018-12-18: 125 mg via INTRAVENOUS
  Filled 2018-12-18: qty 2

## 2018-12-18 MED ORDER — PREDNISONE 20 MG PO TABS: ORAL_TABLET | ORAL | 0 refills | Status: DC

## 2018-12-18 MED ORDER — FAMOTIDINE IN NACL 20-0.9 MG/50ML-% IV SOLN
20.0000 mg | Freq: Once | INTRAVENOUS | Status: AC
  Administered 2018-12-18: 20 mg via INTRAVENOUS
  Filled 2018-12-18: qty 50

## 2018-12-18 NOTE — Discharge Instructions (Addendum)
Call your primary physician to review your medications and start you on the appropriate blood pressure medications.  Stop taking your most recent blood pressure medication.  Return immediately for any worsening of your swelling, difficulty breathing, rash or any concerns.

## 2018-12-18 NOTE — ED Triage Notes (Signed)
Pt c/o lip swelling that she noticed when she woke up this morning. Pt states she does take BP medication, but unsure of what kind. Denies SOB, tongue swelling, or difficulty swallowing. Denies rash or new medications.

## 2018-12-18 NOTE — ED Provider Notes (Signed)
[facility] EMERGENCY DEPARTMENT Provider Note   CSN: [id number] Arrival date & time: 12/18/18  1528    History   Chief Complaint Chief Complaint  Patient presents with  . Oral Swelling    HPI Sandra Gross is a 68 y.o. female.     HPI Patient presents with upper lip swelling that she noted this morning when she woke up.  She denies any intraoral swelling.  No difficulty swallowing or difficulty breathing.  No new rashes.  Patient states he was switched to a different blood pressure medication several months ago but does not remember the name.  No previously similar reactions.  No other new exposures. Past Medical History:  Diagnosis Date  . 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 Depression   . Hiatal hernia   . High cholesterol   . Hypertension   . Polyosteoarthritis     Patient Active Problem List   Diagnosis Date Noted  . Constipation 07/17/2016  . Nausea without vomiting 07/17/2016  . Colitis 05/19/2015  . HTN (hypertension) 05/19/2015    Past Surgical History:  Procedure Laterality Date  . BLADDER SURGERY    . [place]    . CHOLECYSTECTOMY    . COLONOSCOPY     remote past in [place], unsure who performed or when   . UTERINE FIBROID SURGERY     X 2     OB History    Gravida  1   Para      Term      Preterm      AB      Living  1     SAB      TAB      Ectopic      Multiple      Live Births  1            Home Medications    Prior to Admission medications   Medication Sig Start Date End Date Taking? Authorizing Provider  amLODipine (NORVASC) 5 MG tablet Take 5 mg by mouth daily.    [provider]  etodolac (LODINE) 300 MG capsule Take 300 mg by mouth 2 (two) times daily.    [provider]  linaclotide (LINZESS) 72 MCG capsule Take 1 capsule (72 mcg total) by mouth daily before breakfast. Patient taking differently: Take 72 mcg by mouth every other day.  07/17/16    Annitta Needs, NP  losartan-hydrochlorothiazide (HYZAAR) 100-25 MG tablet Take 1 tablet by mouth daily. 05/12/15   [provider]  lubiprostone (AMITIZA) 24 MCG capsule Take 1 capsule (24 mcg total) by mouth 2 (two) times daily with a meal. 07/09/17   Fields, Marga Melnick, MD  meloxicam (MOBIC) 15 MG tablet Take 15 mg by mouth daily as needed for pain.  05/12/15   [provider]  omeprazole (PRILOSEC) 40 MG capsule Take 40 mg by mouth daily.    [provider]  ondansetron (ZOFRAN) 4 MG tablet Take 1 tablet (4 mg total) by mouth every 8 (eight) hours as needed for nausea or vomiting. Patient not taking: Reported on 07/09/2017 07/17/16   Annitta Needs, NP  potassium chloride (K-DUR) 10 MEQ tablet Take 20 mEq by mouth daily.    [provider]  predniSONE (DELTASONE) 20 MG tablet 3 tabs po day one, then 2 po daily x 4 days 12/18/18   Julianne Rice, MD  ranitidine (ZANTAC) 300 MG  tablet Take 300 mg by mouth daily. 05/12/15   [provider]  simvastatin (ZOCOR) 40 MG tablet Take 40 mg by mouth daily. 05/12/15   [provider]    Family History Family History  Problem Relation Age of Onset  . Cancer Sister        unsure what type     Social History Social History   Tobacco Use  . Smoking status: Never Smoker  . Smokeless tobacco: Never Used  Substance Use Topics  . Alcohol use: No    Frequency: Never  . Drug use: No     Allergies   Patient has no known allergies.   Review of Systems Review of Systems  Constitutional: Negative for chills and fever.  HENT: Positive for facial swelling. Negative for sore throat and trouble swallowing.   Respiratory: Negative for cough, shortness of breath, wheezing and stridor.   Cardiovascular: Negative for chest pain.  Gastrointestinal: Negative for abdominal pain, nausea and vomiting.  Musculoskeletal: Negative for back pain, myalgias and neck pain.  Skin: Negative for rash and wound.   Neurological: Negative for dizziness, weakness, light-headedness, numbness and headaches.  All other systems reviewed and are negative.    Physical Exam Updated Vital Signs BP (!) 171/115 (BP Location: Left Arm)   Pulse 94   Temp 98.4 F (36.9 C) (Oral)   Resp 16   Ht 4\' 11"  (1.499 m)   Wt 83 kg   SpO2 100%   BMI 36.96 kg/m   Physical Exam Vitals signs and nursing note reviewed.  Constitutional:      Appearance: Normal appearance. She is well-developed.  HENT:     Head: Normocephalic and atraumatic.     Comments: Swelling to the upper lip bilaterally.  No intraoral swelling.    Nose: Nose normal.     Mouth/Throat:     Mouth: Mucous membranes are moist.     Pharynx: No oropharyngeal exudate or posterior oropharyngeal erythema.  Eyes:     Pupils: Pupils are equal, round, and reactive to light.  Neck:     Musculoskeletal: Normal range of motion and neck supple. No neck rigidity or muscular tenderness.  Cardiovascular:     Rate and Rhythm: Normal rate and regular rhythm.  Pulmonary:     Effort: Pulmonary effort is normal. No respiratory distress.     Breath sounds: Normal breath sounds. No stridor. No wheezing, rhonchi or rales.  Chest:     Chest wall: No tenderness.  Abdominal:     General: Bowel sounds are normal.     Palpations: Abdomen is soft.     Tenderness: There is no abdominal tenderness. There is no guarding or rebound.  Musculoskeletal: Normal range of motion.        General: No tenderness.  Lymphadenopathy:     Cervical: No cervical adenopathy.  Skin:    General: Skin is warm and dry.     Capillary Refill: Capillary refill takes less than 2 seconds.     Findings: No erythema or rash.  Neurological:     General: No focal deficit present.     Mental Status: She is alert and oriented to person, place, and time.  Psychiatric:        Behavior: Behavior normal.      ED Treatments / Results  Labs (all labs ordered are listed, but only abnormal results  are displayed) Labs Reviewed - No data to display  EKG None  Radiology No results found.  Procedures Procedures (  including critical care time)  Medications Ordered in ED Medications  methylPREDNISolone sodium succinate (SOLU-MEDROL) 125 mg/2 mL injection 125 mg (125 mg Intravenous Given 12/18/18 1558)  famotidine (PEPCID) IVPB 20 mg premix (20 mg Intravenous New Bag/Given 12/18/18 1557)  diphenhydrAMINE (BENADRYL) injection 25 mg (25 mg Intravenous Given 12/18/18 1558)     Initial Impression / Assessment and Plan / ED Course  I have reviewed the triage vital signs and the nursing notes.  Pertinent labs & imaging results that were available during my care of the patient were reviewed by me and considered in my medical decision making (see chart for details).        Patient with focal swelling to the upper lip.  No airway compromise at this point.  Suspect angioedema likely due to blood pressure medication she was recently started on.  We will continue to monitor closely.  Swelling is significantly improved after medication.  She has been observed in the emergency department for 2 hours.  She is still unable to remember the name of the blood pressure medication that she was recently started on.  Suspect this may be lisinopril.  She has been advised to stop taking this medication and follow-up closely with her primary physician.  Strict return precautions have been given. Final Clinical Impressions(s) / ED Diagnoses   Final diagnoses:  Angioedema, initial encounter    ED Discharge Orders         Ordered    predniSONE (DELTASONE) 20 MG tablet     12/18/18 1737           Julianne Rice, MD 12/18/18 1737

## 2019-03-09 ENCOUNTER — Encounter (HOSPITAL_COMMUNITY): Payer: Self-pay | Admitting: Emergency Medicine

## 2019-03-09 ENCOUNTER — Emergency Department (HOSPITAL_COMMUNITY)
Admission: EM | Admit: 2019-03-09 | Discharge: 2019-03-09 | Disposition: A | Discharge status: Home / Self Care | Payer: 59 | Attending: Emergency Medicine | Admitting: Emergency Medicine

## 2019-03-09 ENCOUNTER — Other Ambulatory Visit: Payer: Self-pay

## 2019-03-09 ENCOUNTER — Emergency Department (HOSPITAL_COMMUNITY): Payer: 59

## 2019-03-09 DIAGNOSIS — R109 Unspecified abdominal pain: Secondary | ICD-10-CM

## 2019-03-09 DIAGNOSIS — D219 Benign neoplasm of connective and other soft tissue, unspecified: Secondary | ICD-10-CM | POA: Insufficient documentation

## 2019-03-09 DIAGNOSIS — I10 Essential (primary) hypertension: Secondary | ICD-10-CM | POA: Diagnosis not present

## 2019-03-09 DIAGNOSIS — Z79899 Other long term (current) drug therapy: Secondary | ICD-10-CM | POA: Insufficient documentation

## 2019-03-09 DIAGNOSIS — R1032 Left lower quadrant pain: Secondary | ICD-10-CM | POA: Diagnosis present

## 2019-03-09 LAB — COMPREHENSIVE METABOLIC PANEL
ALT: 13 U/L (ref 0–44)
AST: 17 U/L (ref 15–41)
Albumin: 4 g/dL (ref 3.5–5.0)
Alkaline Phosphatase: 129 U/L — ABNORMAL HIGH (ref 38–126)
Anion gap: 6 (ref 5–15)
BUN: 16 mg/dL (ref 8–23)
CO2: 27 mmol/L (ref 22–32)
Calcium: 9.1 mg/dL (ref 8.9–10.3)
Chloride: 106 mmol/L (ref 98–111)
Creatinine, Ser: 0.79 mg/dL (ref 0.44–1.00)
GFR calc Af Amer: >60 mL/min (ref >60)
GFR calc non Af Amer: >60 mL/min (ref >60)
Glucose, Bld: 102 mg/dL — ABNORMAL HIGH (ref 70–99)
Potassium: 4.3 mmol/L (ref 3.5–5.1)
Sodium: 139 mmol/L (ref 135–145)
Total Bilirubin: 0.4 mg/dL (ref 0.3–1.2)
Total Protein: 8.1 g/dL (ref 6.5–8.1)

## 2019-03-09 LAB — URINALYSIS, ROUTINE W REFLEX MICROSCOPIC
Bilirubin Urine: NEGATIVE
Glucose, UA: NEGATIVE mg/dL
Hgb urine dipstick: NEGATIVE
Ketones, ur: NEGATIVE mg/dL
Leukocytes,Ua: NEGATIVE
Nitrite: NEGATIVE
Protein, ur: NEGATIVE mg/dL
Specific Gravity, Urine: 1.023 (ref 1.005–1.030)
pH: 7 (ref 5.0–8.0)

## 2019-03-09 LAB — CBC
HCT: 43.2 % (ref 36.0–46.0)
Hemoglobin: 13.8 g/dL (ref 12.0–15.0)
MCH: 28.6 pg (ref 26.0–34.0)
MCHC: 31.9 g/dL (ref 30.0–36.0)
MCV: 89.6 fL (ref 80.0–100.0)
Platelets: 278 10*3/uL (ref 150–400)
RBC: 4.82 MIL/uL (ref 3.87–5.11)
RDW: 12.6 % (ref 11.5–15.5)
WBC: 3.2 10*3/uL — ABNORMAL LOW (ref 4.0–10.5)
nRBC: 0 % (ref 0.0–0.2)

## 2019-03-09 LAB — LIPASE, BLOOD: Lipase: 23 U/L (ref 11–51)

## 2019-03-09 MED ORDER — LOSARTAN POTASSIUM 25 MG PO TABS
100.0000 mg | ORAL_TABLET | Freq: Once | ORAL | Status: AC
  Administered 2019-03-09: 100 mg via ORAL
  Filled 2019-03-09: qty 4

## 2019-03-09 MED ORDER — LOSARTAN POTASSIUM-HCTZ 100-25 MG PO TABS: 1.0000 | ORAL_TABLET | Freq: Every day | ORAL | 0 refills | Status: AC

## 2019-03-09 MED ORDER — SODIUM CHLORIDE 0.9% FLUSH: 3.0000 mL | Freq: Once | INTRAVENOUS | Status: DC

## 2019-03-09 MED ORDER — SODIUM CHLORIDE 0.9 % IV BOLUS
500.0000 mL | Freq: Once | INTRAVENOUS | Status: AC
  Administered 2019-03-09: 500 mL via INTRAVENOUS

## 2019-03-09 MED ORDER — AMLODIPINE BESYLATE 5 MG PO TABS
5.0000 mg | ORAL_TABLET | Freq: Once | ORAL | Status: AC
  Administered 2019-03-09: 5 mg via ORAL
  Filled 2019-03-09: qty 1

## 2019-03-09 MED ORDER — MORPHINE SULFATE (PF) 4 MG/ML IV SOLN
4.0000 mg | Freq: Once | INTRAVENOUS | Status: AC
  Administered 2019-03-09: 4 mg via INTRAVENOUS
  Filled 2019-03-09: qty 1

## 2019-03-09 MED ORDER — IOHEXOL 300 MG/ML  SOLN
100.0000 mL | Freq: Once | INTRAMUSCULAR | Status: AC | PRN
  Administered 2019-03-09: 100 mL via INTRAVENOUS

## 2019-03-09 MED ORDER — ONDANSETRON HCL 4 MG/2ML IJ SOLN
4.0000 mg | Freq: Once | INTRAMUSCULAR | Status: AC
  Administered 2019-03-09: 4 mg via INTRAVENOUS
  Filled 2019-03-09: qty 2

## 2019-03-09 MED ORDER — AMLODIPINE BESYLATE 5 MG PO TABS: 5.0000 mg | ORAL_TABLET | Freq: Every day | ORAL | 0 refills | Status: AC

## 2019-03-09 MED ORDER — HYDROCHLOROTHIAZIDE 25 MG PO TABS
25.0000 mg | ORAL_TABLET | Freq: Every day | ORAL | Status: DC
  Administered 2019-03-09: 25 mg via ORAL
  Filled 2019-03-09: qty 1

## 2019-03-09 MED ORDER — POTASSIUM CHLORIDE ER 10 MEQ PO TBCR: 10.0000 meq | EXTENDED_RELEASE_TABLET | Freq: Every day | ORAL | 0 refills | Status: AC

## 2019-03-09 NOTE — Discharge Instructions (Addendum)
You were seen in the emergency department today for abdominal pain.  Your labs were overall reassuring.  Your white blood cell count was somewhat low at 3.2, have this rechecked by primary care within 1 to 2 weeks.  Your CT scan showed fibroids which have been present on prior imaging.  Would like you to follow-up with your OB/GYN regarding your fibroids as these may be contributing to your pain. Regarding your hiatal hernia, you may start an over-the-counter antiacid medicine if you start to have regurgitation after eating or heartburn.  Your blood pressure was elevated in the ER we are sending you home with a 1 month supply of your blood pressure medicines be sure to take these as prescribed and have your blood pressure rechecked by primary care in 1 to 2 weeks.  If you do not have a primary care provider please see the list of local primary care providers in the area or call the phone number circled in your discharge instructions.  Return to the ER for new or worsening symptoms including but not limited to worsening pain, inability to keep fluids down, inability to have a bowel movement, blood in stool or vomit, fever, chest pain, trouble breathing, or any other concerns.  [facility] Primary Care Doctor List    Sinda Du MD. Specialty: Pulmonary Disease Contact information: [place]  [place] [address]  [phone]   Tula Nakayama, MD. Specialty: [facility] Medicine Contact information: [address], [address] [place] [address]  [phone]   Sallee Lange, MD. Specialty: [facility] Medicine Contact information: [place]  [place] [address]  [phone]   Rosita Fire, MD Specialty: Internal Medicine Contact information: [place] [address]  [phone]   Delphina Cahill, MD. Specialty: Internal Medicine Contact information: [place] [address]  [phone]    [facility] [place] [facility] (Dr. Maudie Mercury)  Specialty: Family Medicine Contact information: [place] [address]  [phone]   Leslie Andrea, MD. Specialty: [facility] Medicine Contact information: [place] [address]  [phone]   Asencion Noble, MD. Specialty: Internal Medicine Contact information: [address]  [place] [address]  [place]  [address] [place], [place] [address] [phone]  Services [facility] offers a variety of basic health services.  Services include but are not limited to: Blood pressure checks  Heart rate checks  Blood sugar checks  Urine analysis  Rapid strep tests  Pregnancy tests.  Health education and referrals  People needing more complex services will be directed to a physician online. Using these virtual visits, doctors can evaluate and prescribe medicine and treatments. There will be no medication on-site, though [facility] will help patients fill their prescriptions at little to no cost.   For More information please go to: [email or web address]

## 2019-03-09 NOTE — ED Triage Notes (Signed)
Per EMS patient from home. Patient complains of left lower abdominal pain. Patient states she states history of abdominal hernia and complains of pain from hernia. NAD

## 2019-03-09 NOTE — ED Provider Notes (Signed)
[facility] EMERGENCY DEPARTMENT Provider Note   CSN: [id number] Arrival date & time: 03/09/19  1223     History   Chief Complaint Chief Complaint  Patient presents with   Abdominal Pain    HPI Sandra Gross is a 68 y.o. female with a hx of hypercholesterolemia, HTN, brain aneurysm s/p repair, hiatal hernia, colitis, & prior cholecystectomy, uterine fibroid surgery, & c-section who presents to the ED w/ complaints of abdominal pain x 1 year, worse over the past 24 hours. Patient states she has had issues w/ LLQ for about 1 year now, states it is fairly constant, occassionally will get a day of relief, sometimes helps to take her oxycodone prescribed by pain management, no other alleviating/aggravating factors. Seemed worse over the past 24 hours & she is tired of dealing with it prompting ED visit. Current pain si a 6/10 in severity.  She states she had surgery several years ago related to somewhat similar pain but is unsure what type of surgery exactly. Denies fever, chills, nausea, vomiting, diarrhea, melena, constipation, or dysuria.   Her BP was noted to be high, states she ran out of her anti-hypertensive medicines and is trying to get int touch with her PCP to send in refills. Denies visual disturbance, headache, numbness, weakness, dizziness, chest pain, or dyspnea.      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    Depression    Hiatal hernia    High cholesterol    Hypertension    Polyosteoarthritis     Patient Active Problem List   Diagnosis Date Noted   Constipation 07/17/2016   Nausea without vomiting 07/17/2016   Colitis 05/19/2015   HTN (hypertension) 05/19/2015    Past Surgical History:  Procedure Laterality Date   [place]     remote past in [place], unsure who performed or when    UTERINE FIBROID  SURGERY     X 2     OB History    Gravida  1   Para      Term      Preterm      AB      Living  1     SAB      TAB      Ectopic      Multiple      Live Births  1            Home Medications    Prior to Admission medications   Medication Sig Start Date End Date Taking? Authorizing Provider  amLODipine (NORVASC) 5 MG tablet Take 5 mg by mouth daily.    [provider]  etodolac (LODINE) 300 MG capsule Take 300 mg by mouth 2 (two) times daily.    [provider]  linaclotide (LINZESS) 72 MCG capsule Take 1 capsule (72 mcg total) by mouth daily before breakfast. Patient taking differently: Take 72 mcg by mouth every other day.  07/17/16   Annitta Needs, NP  losartan-hydrochlorothiazide (HYZAAR) 100-25 MG tablet Take 1 tablet by mouth daily. 05/12/15   [provider]  lubiprostone (AMITIZA) 24 MCG capsule Take 1 capsule (24 mcg total) by mouth 2 (two) times daily with a meal. 07/09/17   Fields, Marga Melnick, MD  meloxicam (MOBIC) 15 MG tablet Take 15  mg by mouth daily as needed for pain.  05/12/15   [provider]  omeprazole (PRILOSEC) 40 MG capsule Take 40 mg by mouth daily.    [provider]  ondansetron (ZOFRAN) 4 MG tablet Take 1 tablet (4 mg total) by mouth every 8 (eight) hours as needed for nausea or vomiting. Patient not taking: Reported on 07/09/2017 07/17/16   Annitta Needs, NP  potassium chloride (K-DUR) 10 MEQ tablet Take 20 mEq by mouth daily.    [provider]  predniSONE (DELTASONE) 20 MG tablet 3 tabs po day one, then 2 po daily x 4 days 12/18/18   Julianne Rice, MD  ranitidine (ZANTAC) 300 MG tablet Take 300 mg by mouth daily. 05/12/15   [provider]  simvastatin (ZOCOR) 40 MG tablet Take 40 mg by mouth daily. 05/12/15   [provider]    Family History Family History  Problem Relation Age of Onset   Cancer Sister        unsure what type     Social History Social  History   Tobacco Use   Smoking status: Never Smoker   Smokeless tobacco: Never Used  Substance Use Topics   Alcohol use: No    Frequency: Never   Drug use: No     Allergies   Patient has no known allergies.   Review of Systems Review of Systems  Constitutional: Negative for chills and fever.  Eyes: Negative for visual disturbance.  Respiratory: Negative for shortness of breath.   Cardiovascular: Negative for chest pain.  Gastrointestinal: Positive for abdominal pain. Negative for blood in stool, constipation, diarrhea, nausea and vomiting.  Genitourinary: Negative for dysuria.  Neurological: Negative for dizziness, weakness, numbness and headaches.  All other systems reviewed and are negative.    Physical Exam Updated Vital Signs BP (!) 195/110 (BP Location: Right Arm)    Pulse 95    Temp 98.3 F (36.8 C) (Oral)    Resp 18    Ht 4' 11"  (1.499 m)    Wt 83 kg    SpO2 100%    BMI 36.96 kg/m   Physical Exam Vitals signs and nursing note reviewed.  Constitutional:      General: She is not in acute distress.    Appearance: She is well-developed. She is not toxic-appearing.  HENT:     Head: Normocephalic and atraumatic.  Eyes:     General:        Right eye: No discharge.        Left eye: No discharge.     Conjunctiva/sclera: Conjunctivae normal.  Neck:     Musculoskeletal: Neck supple.  Cardiovascular:     Rate and Rhythm: Normal rate and regular rhythm.  Pulmonary:     Effort: Pulmonary effort is normal. No respiratory distress.     Breath sounds: Normal breath sounds. No wheezing, rhonchi or rales.  Abdominal:     General: There is no distension.     Palpations: Abdomen is soft.     Tenderness: There is abdominal tenderness in the suprapubic area and left lower quadrant. There is no guarding or rebound.  Skin:    General: Skin is warm and dry.     Findings: No rash.  Neurological:     Mental Status: She is alert.     Comments: Clear speech.     Psychiatric:        Behavior: Behavior normal.    ED Treatments / Results  Labs (all labs  ordered are listed, but only abnormal results are displayed) Labs Reviewed  COMPREHENSIVE METABOLIC PANEL - Abnormal; Notable for the following components:      Result Value   Glucose, Bld 102 (*)    Alkaline Phosphatase 129 (*)    All other components within normal limits  CBC - Abnormal; Notable for the following components:   WBC 3.2 (*)    All other components within normal limits  LIPASE, BLOOD  URINALYSIS, ROUTINE W REFLEX MICROSCOPIC    EKG None  Radiology Ct Abdomen Pelvis W Contrast  Result Date: 03/09/2019 CLINICAL DATA:  Per EMS patient from home. Patient complains of left lower abdominal pain. Patient states she states history of abdominal hernia and complains of pain from hernia. EXAM: CT ABDOMEN AND PELVIS WITH CONTRAST TECHNIQUE: Multidetector CT imaging of the abdomen and pelvis was performed using the standard protocol following bolus administration of intravenous contrast. CONTRAST:  140m OMNIPAQUE IOHEXOL 300 MG/ML  SOLN COMPARISON:  CT abdomen pelvis 07/13/2016 FINDINGS: Lower chest: Small hiatal hernia. Minimal bibasilar atelectasis. Hepatobiliary: No focal liver abnormality is seen. Status post cholecystectomy. Mildly prominent intrahepatic bile ducts similar to prior likely related to cholecystectomy. Pancreas: Unremarkable. No pancreatic ductal dilatation or surrounding inflammatory changes. Spleen: Normal in size without focal abnormality. Adrenals/Urinary Tract: Adrenal glands are unremarkable. Kidneys are normal, without renal calculi, focal lesion, or hydronephrosis. Bladder is unremarkable. Stomach/Bowel: Stomach is within normal limits. Appendix appears normal. No evidence of bowel wall thickening, distention, or inflammatory changes. Multiple colonic diverticula without evidence of diverticulitis. Vascular/Lymphatic: No significant vascular findings are present. No  enlarged abdominal or pelvic lymph nodes. Reproductive: Uterus is again mildly prominent with fibroid change and endometrial thickening, as described on prior CT from 2018 and seen on pelvic ultrasound from 2017. No adnexal masses. Other: No abdominal wall hernia or abnormality. No abdominopelvic ascites. Musculoskeletal: Degenerative changes in the bilateral sacroiliac joints. IMPRESSION: 1. No CT evidence of acute intra-abdominal or intrapelvic pathology. 2. Colonic diverticulosis without evidence of diverticulitis. 3. Prominent uterus with fibroid change and endometrial thickening, similar to prior CT from 2018 and described on pelvic ultrasound from 2017. Electronically Signed   By: NAudie PintoM.D.   On: 03/09/2019 18:58    Procedures Procedures (including critical care time)  Medications Ordered in ED Medications  sodium chloride flush (NS) 0.9 % injection 3 mL (3 mLs Intravenous Not Given 03/09/19 1411)     Initial Impression / Assessment and Plan / ED Course  I have reviewed the triage vital signs and the nursing notes.  Pertinent labs & imaging results that were available during my care of the patient were reviewed by me and considered in my medical decision making (see chart for details).   Patient presents to the ED with complaints of abdominal pain x 1 year worsened over past 24 hours. Nontoxic appearing, no apparent distress, vitals WNL with the exception of elevated BP in setting of not having her anti-hypertensive medicines- doubt HTN emergency- will given home BP med doses in the ED. Exam w/ LLQ & suprapubic abdominal tenderness, no peritoneal signs. Plan for labs & CT A/P for further assessment.   CBC: mild leukopenia- will need pcp recheck. No anemia.  CMP: Mildly elevated alk phos. No electrolyte derangement. LFTs & renal function WNL Lipase: WNL UA: no UTI  CT A/P: 1. No CT evidence of acute intra-abdominal or intrapelvic pathology. 2. Colonic diverticulosis without  evidence of diverticulitis. 3. Prominent uterus with fibroid change and endometrial thickening, similar  to prior CT from 2018 and described on pelvic ultrasound from 2017  Pain may be fibroid related, no clear definitive etiology, remains w/o peritoneal signs- doubt appendicitis, diverticulitis, colitis, perf, or obstruction. PCP/obgyn follow up. BP improved some following oral antihypertensives in the ED- will given 30 day supply of her BP meds w/ potassium supplement as this was prescribed previously w/ her hydrochlorothiazide. I discussed results, treatment plan, need for follow-up, and return precautions with the patient & her fiance @ bedside. Provided opportunity for questions, patient & her fiance confirmed understanding and are in agreement with plan.   Findings and plan of care discussed with supervising physician Dr. Laverta Baltimore who is in agreement.    Final Clinical Impressions(s) / ED Diagnoses   Final diagnoses:  Abdominal pain, unspecified abdominal location  Fibroids    ED Discharge Orders         Ordered    amLODipine (NORVASC) 5 MG tablet  Daily     03/09/19 1938    losartan-hydrochlorothiazide (HYZAAR) 100-25 MG tablet  Daily     03/09/19 1938    potassium chloride (KLOR-CON) 10 MEQ tablet  Daily     03/09/19 [address] PA-C 03/09/19 1944    Margette Fast, MD 03/10/19 [phone]

## 2019-07-23 ENCOUNTER — Other Ambulatory Visit: Payer: Self-pay | Admitting: Obstetrics and Gynecology

## 2019-07-23 DIAGNOSIS — D259 Leiomyoma of uterus, unspecified: Secondary | ICD-10-CM

## 2019-08-31 ENCOUNTER — Ambulatory Visit
Admission: RE | Admit: 2019-08-31 | Discharge: 2019-08-31 | Disposition: A | Discharge status: Home / Self Care | Payer: 59 | Source: Ambulatory Visit | Attending: Obstetrics and Gynecology | Admitting: Obstetrics and Gynecology

## 2019-08-31 ENCOUNTER — Other Ambulatory Visit: Payer: Self-pay

## 2019-08-31 DIAGNOSIS — D259 Leiomyoma of uterus, unspecified: Secondary | ICD-10-CM

## 2019-10-19 ENCOUNTER — Telehealth (INDEPENDENT_AMBULATORY_CARE_PROVIDER_SITE_OTHER): Payer: 59 | Admitting: Obstetrics and Gynecology

## 2019-10-19 DIAGNOSIS — Z0189 Encounter for other specified special examinations: Secondary | ICD-10-CM

## 2019-10-19 DIAGNOSIS — D219 Benign neoplasm of connective and other soft tissue, unspecified: Secondary | ICD-10-CM

## 2019-10-19 NOTE — Telephone Encounter (Signed)
Patient is requesting a call back from the nurse. She was seen last year, and thinks she is suppose to have surgery.

## 2019-10-19 NOTE — Telephone Encounter (Signed)
Returned patients call. Patient wants to know if she is supposed to have surgery. Reviewed with patient that she will need to meet and discuss with a provider.   Patient reports she is having concerns with Fibroids. She reports she is not bleeding, she reports she has been sick and nauseaus. She reports her weight is going up and down. She reports she is having hot flashes and not sleeping well.   She goes to the pain clinic and is taking meds as needed. She reports she has arthritis that require pain meds.   Patient reports she has visited several providers and feels like her fibroids are causing her difficulty.   Message to front desk to call patient and set up virtual visit so speak with a provider. Patient would like a virtual visit. She has seen Dr. Barrie Dunker in more recent months, she reports she wants to come back to [facility] for follow up.

## 2020-06-26 ENCOUNTER — Other Ambulatory Visit: Payer: Self-pay | Admitting: Gastroenterology

## 2020-06-26 DIAGNOSIS — R1032 Left lower quadrant pain: Secondary | ICD-10-CM

## 2020-07-18 ENCOUNTER — Inpatient Hospital Stay: Admission: RE | Admit: 2020-07-18 | Payer: Self-pay | Source: Ambulatory Visit

## 2021-08-27 ENCOUNTER — Other Ambulatory Visit (HOSPITAL_COMMUNITY): Payer: Self-pay | Admitting: Gastroenterology

## 2021-08-27 DIAGNOSIS — R1030 Lower abdominal pain, unspecified: Secondary | ICD-10-CM

## 2021-09-11 ENCOUNTER — Encounter (HOSPITAL_COMMUNITY): Payer: Self-pay

## 2021-09-11 ENCOUNTER — Ambulatory Visit (HOSPITAL_COMMUNITY): Payer: Medicare Other

## 2023-07-10 ENCOUNTER — Other Ambulatory Visit: Payer: Self-pay | Admitting: Orthopedic Surgery

## 2023-08-12 ENCOUNTER — Other Ambulatory Visit: Payer: Self-pay

## 2023-08-12 ENCOUNTER — Encounter (HOSPITAL_BASED_OUTPATIENT_CLINIC_OR_DEPARTMENT_OTHER): Payer: Self-pay | Admitting: Orthopedic Surgery

## 2023-08-13 ENCOUNTER — Encounter (HOSPITAL_BASED_OUTPATIENT_CLINIC_OR_DEPARTMENT_OTHER)
Admission: RE | Admit: 2023-08-13 | Discharge: 2023-08-13 | Disposition: A | Discharge status: Home / Self Care | Source: Ambulatory Visit | Attending: Orthopedic Surgery | Admitting: Orthopedic Surgery

## 2023-08-13 DIAGNOSIS — G5601 Carpal tunnel syndrome, right upper limb: Secondary | ICD-10-CM | POA: Diagnosis present

## 2023-08-13 DIAGNOSIS — Z79899 Other long term (current) drug therapy: Secondary | ICD-10-CM | POA: Diagnosis not present

## 2023-08-13 DIAGNOSIS — K219 Gastro-esophageal reflux disease without esophagitis: Secondary | ICD-10-CM | POA: Diagnosis not present

## 2023-08-13 DIAGNOSIS — K449 Diaphragmatic hernia without obstruction or gangrene: Secondary | ICD-10-CM | POA: Diagnosis not present

## 2023-08-13 DIAGNOSIS — I10 Essential (primary) hypertension: Secondary | ICD-10-CM | POA: Diagnosis present

## 2023-08-13 LAB — BASIC METABOLIC PANEL
Anion gap: 7 (ref 5–15)
BUN: 20 mg/dL (ref 8–23)
CO2: 27 mmol/L (ref 22–32)
Calcium: 8.9 mg/dL (ref 8.9–10.3)
Chloride: 106 mmol/L (ref 98–111)
Creatinine, Ser: 1 mg/dL (ref 0.44–1.00)
GFR, Estimated: 60 mL/min — ABNORMAL LOW (ref >60)
Glucose, Bld: 107 mg/dL — ABNORMAL HIGH (ref 70–99)
Potassium: 3.7 mmol/L (ref 3.5–5.1)
Sodium: 140 mmol/L (ref 135–145)

## 2023-08-13 NOTE — Anesthesia Preprocedure Evaluation (Signed)
 Anesthesia Evaluation  Patient identified by MRN, date of birth, ID band Patient awake    Reviewed: Allergy & Precautions, NPO status , Patient's Chart, lab work & pertinent test results  Airway Mallampati: II  TM Distance: >3 FB Neck ROM: Full    Dental no notable dental hx. (+) Poor Dentition, Missing, Dental Advisory Given   Pulmonary    Pulmonary exam normal breath sounds clear to auscultation       Cardiovascular hypertension, Pt. on medications (-) angina (-) Past MI Normal cardiovascular exam Rhythm:Regular Rate:Normal     Neuro/Psych  PSYCHIATRIC DISORDERS  Depression       GI/Hepatic hiatal hernia,GERD  Medicated and Controlled,,  Endo/Other    Renal/GU      Musculoskeletal  (+) Arthritis ,    Abdominal   Peds  Hematology   Anesthesia Other Findings   Reproductive/Obstetrics                             Anesthesia Physical Anesthesia Plan  ASA: 2  Anesthesia Plan: Bier Block and Bier Block-Lidocaine Only   Post-op Pain Management: Regional block* and Minimal or no pain anticipated   Induction:   PONV Risk Score and Plan: Propofol infusion, Treatment may vary due to age or medical condition and Midazolam  Airway Management Planned: Nasal Cannula and Natural Airway  Additional Equipment: None  Intra-op Plan:   Post-operative Plan:   Informed Consent: I have reviewed the patients History and Physical, chart, labs and discussed the procedure including the risks, benefits and alternatives for the proposed anesthesia with the patient or authorized representative who has indicated his/her understanding and acceptance.     Dental advisory given  Plan Discussed with: CRNA and Surgeon  Anesthesia Plan Comments:        Anesthesia Quick Evaluation

## 2023-08-13 NOTE — Progress Notes (Signed)
 Patient was provided with CHG cleanser to use at home before the procedure. Patient verbalized understanding of instructions.Patient was provided with CHG cleanser to use at home before the procedure. Patient verbalized understanding of instructions.     Enhanced Recovery after Surgery Enhanced Recovery after Surgery is a protocol used to improve the stress on your body and your recovery after surgery.  Patient Instructions  The night before surgery:  No food after midnight. ONLY clear liquids after midnight  The day of surgery (if you do NOT have diabetes):  Drink ONE (1) Pre-Surgery Clear Ensure as directed.   This drink was given to you during your hospital  pre-op appointment visit. The pre-op nurse will instruct you on the time to drink the  Pre-Surgery Ensure depending on your surgery time. Finish the drink at the designated time by the pre-op nurse.  Nothing else to drink after completing the  Pre-Surgery Clear Ensure.  The day of surgery (if you have diabetes): Drink ONE (1) Gatorade 2 (G2) as directed. This drink was given to you during your hospital  pre-op appointment visit.  The pre-op nurse will instruct you on the time to drink the   Gatorade 2 (G2) depending on your surgery time. Color of the Gatorade may vary. Red is not allowed. Nothing else to drink after completing the  Gatorade 2 (G2).         If you have questions, please contact your surgeon's office.

## 2023-08-14 ENCOUNTER — Encounter (HOSPITAL_BASED_OUTPATIENT_CLINIC_OR_DEPARTMENT_OTHER)
Admission: RE | Disposition: A | Discharge status: Home / Self Care | Payer: Self-pay | Source: Home / Self Care | Attending: Orthopedic Surgery

## 2023-08-14 ENCOUNTER — Ambulatory Visit (HOSPITAL_BASED_OUTPATIENT_CLINIC_OR_DEPARTMENT_OTHER): Admitting: Anesthesiology

## 2023-08-14 ENCOUNTER — Other Ambulatory Visit: Payer: Self-pay

## 2023-08-14 ENCOUNTER — Encounter (HOSPITAL_BASED_OUTPATIENT_CLINIC_OR_DEPARTMENT_OTHER): Payer: Self-pay | Admitting: Orthopedic Surgery

## 2023-08-14 ENCOUNTER — Ambulatory Visit (HOSPITAL_BASED_OUTPATIENT_CLINIC_OR_DEPARTMENT_OTHER)
Admission: RE | Admit: 2023-08-14 | Discharge: 2023-08-14 | Disposition: A | Discharge status: Home / Self Care | Payer: 59 | Attending: Orthopedic Surgery | Admitting: Orthopedic Surgery

## 2023-08-14 DIAGNOSIS — K219 Gastro-esophageal reflux disease without esophagitis: Secondary | ICD-10-CM | POA: Insufficient documentation

## 2023-08-14 DIAGNOSIS — Z79899 Other long term (current) drug therapy: Secondary | ICD-10-CM | POA: Insufficient documentation

## 2023-08-14 DIAGNOSIS — K449 Diaphragmatic hernia without obstruction or gangrene: Secondary | ICD-10-CM | POA: Insufficient documentation

## 2023-08-14 DIAGNOSIS — G5601 Carpal tunnel syndrome, right upper limb: Secondary | ICD-10-CM

## 2023-08-14 DIAGNOSIS — I10 Essential (primary) hypertension: Secondary | ICD-10-CM | POA: Insufficient documentation

## 2023-08-14 HISTORY — DX: Opioid use, unspecified, uncomplicated: F11.90

## 2023-08-14 HISTORY — DX: Other chronic pain: G89.29

## 2023-08-14 HISTORY — DX: Gastro-esophageal reflux disease without esophagitis: K21.9

## 2023-08-14 HISTORY — PX: CARPAL TUNNEL RELEASE: SHX101

## 2023-08-14 SURGERY — CARPAL TUNNEL RELEASE
Anesthesia: Regional | Site: Wrist | Laterality: Right

## 2023-08-14 MED ORDER — BUPIVACAINE HCL (PF) 0.25 % IJ SOLN
INTRAMUSCULAR | Status: DC | PRN
  Administered 2023-08-14: 9 mL

## 2023-08-14 MED ORDER — ONDANSETRON HCL 4 MG/2ML IJ SOLN: 4.0000 mg | Freq: Once | INTRAMUSCULAR | Status: DC | PRN

## 2023-08-14 MED ORDER — FENTANYL CITRATE (PF) 100 MCG/2ML IJ SOLN
INTRAMUSCULAR | Status: DC | PRN
  Administered 2023-08-14 (×2): 25 ug via INTRAVENOUS

## 2023-08-14 MED ORDER — KETOROLAC TROMETHAMINE 30 MG/ML IJ SOLN
INTRAMUSCULAR | Status: DC | PRN
  Administered 2023-08-14: 10 mg via INTRAVENOUS

## 2023-08-14 MED ORDER — ONDANSETRON HCL 4 MG/2ML IJ SOLN
INTRAMUSCULAR | Status: AC
  Filled 2023-08-14: qty 4

## 2023-08-14 MED ORDER — MIDAZOLAM HCL 2 MG/2ML IJ SOLN
INTRAMUSCULAR | Status: AC
  Filled 2023-08-14: qty 2

## 2023-08-14 MED ORDER — ACETAMINOPHEN 10 MG/ML IV SOLN: 1000.0000 mg | Freq: Once | INTRAVENOUS | Status: DC | PRN

## 2023-08-14 MED ORDER — LACTATED RINGERS IV SOLN: INTRAVENOUS | Status: DC

## 2023-08-14 MED ORDER — MIDAZOLAM HCL 2 MG/2ML IJ SOLN
INTRAMUSCULAR | Status: DC | PRN
  Administered 2023-08-14 (×2): .5 mg via INTRAVENOUS

## 2023-08-14 MED ORDER — OXYCODONE HCL 5 MG PO TABS: 5.0000 mg | ORAL_TABLET | Freq: Four times a day (QID) | ORAL | 0 refills | Status: AC | PRN

## 2023-08-14 MED ORDER — LIDOCAINE HCL (PF) 0.5 % IJ SOLN
INTRAMUSCULAR | Status: DC | PRN
  Administered 2023-08-14: 30 mL via INTRAVENOUS

## 2023-08-14 MED ORDER — CEFAZOLIN SODIUM-DEXTROSE 2-4 GM/100ML-% IV SOLN
INTRAVENOUS | Status: AC
  Filled 2023-08-14: qty 100

## 2023-08-14 MED ORDER — ONDANSETRON HCL 4 MG/2ML IJ SOLN
INTRAMUSCULAR | Status: DC | PRN
Start: 2023-08-14 — End: 2023-08-14
  Administered 2023-08-14: 4 mg via INTRAVENOUS

## 2023-08-14 MED ORDER — PROPOFOL 500 MG/50ML IV EMUL
INTRAVENOUS | Status: DC | PRN
  Administered 2023-08-14: 50 ug/kg/min via INTRAVENOUS

## 2023-08-14 MED ORDER — FENTANYL CITRATE (PF) 100 MCG/2ML IJ SOLN: 25.0000 ug | INTRAMUSCULAR | Status: DC | PRN

## 2023-08-14 MED ORDER — CEFAZOLIN SODIUM-DEXTROSE 2-4 GM/100ML-% IV SOLN
2.0000 g | INTRAVENOUS | Status: AC
  Administered 2023-08-14: 2 g via INTRAVENOUS

## 2023-08-14 MED ORDER — FENTANYL CITRATE (PF) 100 MCG/2ML IJ SOLN
INTRAMUSCULAR | Status: AC
  Filled 2023-08-14: qty 2

## 2023-08-14 SURGICAL SUPPLY — 31 items
BLADE SURG 15 STRL LF DISP TIS (BLADE) ×2 IMPLANT
BNDG ELASTIC 3INX 5YD STR LF (GAUZE/BANDAGES/DRESSINGS) ×1 IMPLANT
BNDG ELASTIC 6X10 VLCR STRL LF (GAUZE/BANDAGES/DRESSINGS) IMPLANT
BNDG ESMARK 4X9 LF (GAUZE/BANDAGES/DRESSINGS) IMPLANT
BNDG GAUZE DERMACEA FLUFF 4 (GAUZE/BANDAGES/DRESSINGS) ×1 IMPLANT
CHLORAPREP W/TINT 26 (MISCELLANEOUS) ×1 IMPLANT
CORD BIPOLAR FORCEPS 12FT (ELECTRODE) ×1 IMPLANT
COVER BACK TABLE 60X90IN (DRAPES) ×1 IMPLANT
COVER MAYO STAND STRL (DRAPES) ×1 IMPLANT
CUFF TOURN SGL QUICK 18X4 (TOURNIQUET CUFF) ×1 IMPLANT
DRAPE EXTREMITY T 121X128X90 (DISPOSABLE) ×1 IMPLANT
DRAPE SURG 17X23 STRL (DRAPES) ×1 IMPLANT
GAUZE PAD ABD 8X10 STRL (GAUZE/BANDAGES/DRESSINGS) ×1 IMPLANT
GAUZE SPONGE 4X4 12PLY STRL (GAUZE/BANDAGES/DRESSINGS) ×1 IMPLANT
GAUZE XEROFORM 1X8 LF (GAUZE/BANDAGES/DRESSINGS) ×1 IMPLANT
GLOVE BIO SURGEON STRL SZ7.5 (GLOVE) ×1 IMPLANT
GLOVE BIOGEL PI IND STRL 8 (GLOVE) ×1 IMPLANT
GOWN STRL REUS W/ TWL LRG LVL3 (GOWN DISPOSABLE) ×1 IMPLANT
GOWN STRL REUS W/TWL XL LVL3 (GOWN DISPOSABLE) ×1 IMPLANT
NDL HYPO 25X1 1.5 SAFETY (NEEDLE) ×1 IMPLANT
NEEDLE HYPO 25X1 1.5 SAFETY (NEEDLE) ×1 IMPLANT
NS IRRIG 1000ML POUR BTL (IV SOLUTION) ×1 IMPLANT
PACK BASIN DAY SURGERY FS (CUSTOM PROCEDURE TRAY) ×1 IMPLANT
PADDING CAST ABS COTTON 4X4 ST (CAST SUPPLIES) ×1 IMPLANT
PADDING CAST SYNTHETIC 3X4 NS (CAST SUPPLIES) IMPLANT
STOCKINETTE 4X48 STRL (DRAPES) ×1 IMPLANT
SUT ETHILON 4 0 PS 2 18 (SUTURE) ×1 IMPLANT
SYR BULB EAR ULCER 3OZ GRN STR (SYRINGE) ×1 IMPLANT
SYR CONTROL 10ML LL (SYRINGE) ×1 IMPLANT
TOWEL GREEN STERILE FF (TOWEL DISPOSABLE) ×2 IMPLANT
UNDERPAD 30X36 HEAVY ABSORB (UNDERPADS AND DIAPERS) ×1 IMPLANT

## 2023-08-14 NOTE — Anesthesia Postprocedure Evaluation (Signed)
 Anesthesia Post Note  Patient: Sandra Gross  Procedure(s) Performed: RIGHT CARPAL TUNNEL RELEASE (Right: Wrist)     Patient location during evaluation: PACU Anesthesia Type: Bier Block Level of consciousness: awake and alert Pain management: pain level controlled Vital Signs Assessment: post-procedure vital signs reviewed and stable Respiratory status: spontaneous breathing, nonlabored ventilation, respiratory function stable and patient connected to nasal cannula oxygen Cardiovascular status: stable and blood pressure returned to baseline Postop Assessment: no apparent nausea or vomiting Anesthetic complications: no  No notable events documented.  Last Vitals:  Vitals:   08/14/23 1046 08/14/23 1054  BP: 119/81 123/79  Pulse: 74 69  Resp: 15 20  Temp:  (!) 36.1 C  SpO2: 96% 95%    Last Pain:  Vitals:   08/14/23 1054  TempSrc: Temporal  PainSc: 0-No pain                 Trevor Iha

## 2023-08-14 NOTE — H&P (Signed)
 Sandra Gross is an 73 y.o. female.   Chief Complaint: carpal tunnel syndrome HPI: 73 y.o. yo female with numbness and tingling right hand.  Nocturnal symptoms. Positive nerve conduction studies. She wishes to have right carpal tunnel release.   Allergies: No Known Allergies  Past Medical History:  Diagnosis Date   Aneurysm (HCC)    brain    Chronic pain    Chronic, continuous use of opioids    Depression    GERD (gastroesophageal reflux disease)    Hiatal hernia    High cholesterol    Hypertension    Polyosteoarthritis     Past Surgical History:  Procedure Laterality Date   BLADDER SURGERY     BRAIN SURGERY  2000   [facility]    CESAREAN SECTION     CHOLECYSTECTOMY     COLONOSCOPY     remote past in [place], unsure who performed or when    UTERINE FIBROID SURGERY     X 2    Family History: Family History  Problem Relation Age of Onset   Cancer Sister        unsure what type     Social History:   reports that she has never smoked. She has never used smokeless tobacco. She reports that she does not drink alcohol and does not use drugs.  Medications: Medications Prior to Admission  Medication Sig Dispense Refill   amLODipine (NORVASC) 5 MG tablet Take 1 tablet (5 mg total) by mouth daily. (Patient taking differently: Take 10 mg by mouth daily.) 30 tablet 0   cholecalciferol (VITAMIN D3) 25 MCG (1000 UNIT) tablet Take 1,000 Units by mouth daily.     diazepam (VALIUM) 2 MG tablet Take 2 mg by mouth daily.     famotidine (PEPCID) 20 MG tablet Take 20 mg by mouth daily.     losartan-hydrochlorothiazide (HYZAAR) 100-25 MG tablet Take 1 tablet by mouth daily. 30 tablet 0   Oxycodone HCl 10 MG TABS Take 10 mg by mouth 6 (six) times daily.     potassium chloride (KLOR-CON) 10 MEQ tablet Take 1 tablet (10 mEq total) by mouth daily. (Patient not taking: Reported on 08/12/2023) 30 tablet 0   simvastatin (ZOCOR) 40 MG tablet Take 40 mg by mouth daily.       Results for orders placed or performed during the hospital encounter of 08/14/23 (from the past 48 hours)  Basic metabolic panel per protocol     Status: Abnormal   Collection Time: 08/13/23 11:00 AM  Result Value Ref Range   Sodium 140 135 - 145 mmol/L   Potassium 3.7 3.5 - 5.1 mmol/L   Chloride 106 98 - 111 mmol/L   CO2 27 22 - 32 mmol/L   Glucose, Bld 107 (H) 70 - 99 mg/dL    Comment: Glucose reference range applies only to samples taken after fasting for at least 8 hours.   BUN 20 8 - 23 mg/dL   Creatinine, Ser 8.65 0.44 - 1.00 mg/dL   Calcium 8.9 8.9 - 78.4 mg/dL   GFR, Estimated 60 (L) >60 mL/min    Comment: (NOTE) Calculated using the CKD-EPI Creatinine Equation (2021)    Anion gap 7 5 - 15    Comment: Performed at [facility], [address] [place], [place] [address]    No results found.    Blood pressure 117/75, pulse 92, temperature (!) 97.1 F (36.2 C), temperature source Oral, resp. rate 14, height 4\' 11"  (1.499  m), weight 83.8 kg, SpO2 97%.  General appearance: alert, cooperative, and appears stated age Head: Normocephalic, without obvious abnormality, atraumatic Neck: supple, symmetrical, trachea midline Extremities: Intact capillary refill all digits. Decreased sensation. +epl/fpl/io.  No wounds.  Skin: Skin color, texture, turgor normal. No rashes or lesions Neurologic: Grossly normal Incision/Wound: none  Assessment/Plan Right carpal tunnel syndrome.  Non operative and operative treatment options have been discussed with the patient and patient wishes to proceed with operative treatment. Risks, benefits, and alternatives of surgery have been discussed and the patient agrees with the plan of care.   Betha Loa 08/14/2023, 8:39 AM

## 2023-08-14 NOTE — Op Note (Signed)
 08/14/2023 [facility]                              OPERATIVE REPORT   PREOPERATIVE DIAGNOSIS:  Right carpal tunnel syndrome  POSTOPERATIVE DIAGNOSIS:  Right carpal tunnel syndrome  PROCEDURE:  Right carpal tunnel release  SURGEON:  Betha Loa, MD  ASSISTANT:  none.  ANESTHESIA: Bier block with sedation  IV FLUIDS:  Per anesthesia flow sheet  ESTIMATED BLOOD LOSS:  Minimal  COMPLICATIONS:  None  SPECIMENS:  None  TOURNIQUET TIME:    Total Tourniquet Time Documented: Forearm (Right) - 22 minutes Total: Forearm (Right) - 22 minutes   DISPOSITION:  Stable to PACU  LOCATION: [facility]  INDICATIONS:  73 y.o. yo female with numbness and tingling right hand.  Nocturnal symptoms. Positive nerve conduction studies. She wishes to proceed with right carpal tunnel release.  Risks, benefits and alternatives of surgery were discussed including the risk of blood loss; infection; damage to nerves, vessels, tendons, ligaments, bone; failure of surgery; need for additional surgery; complications with wound healing; continued pain; recurrence of carpal tunnel syndrome; and damage to motor branch. She voiced understanding of these risks and elected to proceed.   OPERATIVE COURSE:  After being identified preoperatively by myself, the patient and I agreed upon the procedure and site of procedure.  The surgical site was marked.  Surgical consent had been signed.  She was given IV Ancef as preoperative antibiotic prophylaxis.  She was transferred to the operating room and placed on the operating room table in supine position with the Right upper extremity on an armboard.  Bier block anesthesia was induced by the anesthesiologist.  Right upper extremity was prepped and draped in normal sterile orthopaedic fashion.  A surgical pause was performed between the surgeons, anesthesia, and operating room staff, and all were in agreement as to the patient, procedure, and site of  procedure.  Tourniquet at the proximal aspect of the forearm had been inflated for the Bier block  Incision was made over the transverse carpal ligament and carried into the subcutaneous tissues by spreading technique.  Bipolar electrocautery was used to obtain hemostasis.  The palmar fascia was sharply incised.  The transverse carpal ligament was identified.  The fascia distal to the ligament was opened.  Retractor was placed and the flexor tendons were identified.  The flexor tendon to the ring finger was identified and retracted radially.  The transverse carpal ligament was then incised from distal to proximal under direct visualization.  Scissors were used to split the distal aspect of the volar antebrachial fascia.  A finger was placed into the wound to ensure complete decompression, which was the case.  The nerve was examined.  It was flattened and hyperemic. It was bifid.  The motor branch was identified and was intact.  The wound was copiously irrigated with sterile saline.  It was then closed with 4-0 nylon in a horizontal mattress fashion.  It was injected with 0.25% plain Marcaine to aid in postoperative analgesia.  It was dressed with sterile Xeroform, 4x4s, an ABD, and wrapped with Kerlix and an Ace bandage.  Tourniquet was deflated at 22 minutes.  Fingertips were pink with brisk capillary refill after deflation of the tourniquet.  Operative drapes were broken down.  The patient was awoken from anesthesia safely.  She was transferred back to stretcher and taken to the PACU in stable condition.  I will  see her back in the office in 1 week for postoperative followup.  I will give her a prescription for oxycodone 5mg  po q6 hours prn pain dispense #15.    Betha Loa, MD Electronically signed, 08/14/23

## 2023-08-14 NOTE — Discharge Instructions (Addendum)

## 2023-08-14 NOTE — Anesthesia Procedure Notes (Signed)
 Procedure Name: MAC Date/Time: 08/14/2023 9:53 AM  Performed by: Demetrio Lapping, CRNAPre-anesthesia Checklist: Patient identified, Emergency Drugs available, Suction available, Patient being monitored and Timeout performed Patient Re-evaluated:Patient Re-evaluated prior to induction Oxygen Delivery Method: Simple face mask Placement Confirmation: positive ETCO2 Dental Injury: Teeth and Oropharynx as per pre-operative assessment

## 2023-08-14 NOTE — Transfer of Care (Signed)
 Immediate Anesthesia Transfer of Care Note  Patient: Sandra Gross  Procedure(s) Performed: RIGHT CARPAL TUNNEL RELEASE (Right: Wrist)  Patient Location: PACU  Anesthesia Type:MAC  Level of Consciousness: awake, alert , oriented, and patient cooperative  Airway & Oxygen Therapy: Patient Spontanous Breathing and Patient connected to face mask oxygen  Post-op Assessment: Report given to RN and Post -op Vital signs reviewed and stable  Post vital signs: Reviewed and stable  Last Vitals:  Vitals Value Taken Time  BP 117/64 08/14/23 1023  Temp    Pulse 75 08/14/23 1026  Resp 15 08/14/23 1026  SpO2 100 % 08/14/23 1026  Vitals shown include unfiled device data.  Last Pain:  Vitals:   08/14/23 0821  TempSrc: Oral         Complications: No notable events documented.

## 2023-08-15 ENCOUNTER — Encounter (HOSPITAL_BASED_OUTPATIENT_CLINIC_OR_DEPARTMENT_OTHER): Payer: Self-pay | Admitting: Orthopedic Surgery

## 2023-12-08 ENCOUNTER — Other Ambulatory Visit (HOSPITAL_COMMUNITY): Payer: Self-pay | Admitting: Gerontology

## 2023-12-08 ENCOUNTER — Ambulatory Visit (HOSPITAL_COMMUNITY)
Admission: RE | Admit: 2023-12-08 | Discharge: 2023-12-08 | Disposition: A | Discharge status: Home / Self Care | Source: Ambulatory Visit | Attending: Gerontology | Admitting: Gerontology

## 2023-12-08 DIAGNOSIS — R0602 Shortness of breath: Secondary | ICD-10-CM | POA: Diagnosis present
